# Patient Record
Sex: Female | Born: 1985 | Race: White | Hispanic: No | Marital: Married | Smoking: Never smoker
Health system: Southern US, Community
[De-identification: ages and names within clinical notes are randomized; demographics above are authoritative.]

## PROBLEM LIST (undated history)

## (undated) ENCOUNTER — Inpatient Hospital Stay (HOSPITAL_COMMUNITY): Payer: Self-pay

## (undated) DIAGNOSIS — E781 Pure hyperglyceridemia: Secondary | ICD-10-CM

## (undated) DIAGNOSIS — R51 Headache: Secondary | ICD-10-CM

## (undated) DIAGNOSIS — G4700 Insomnia, unspecified: Secondary | ICD-10-CM

## (undated) DIAGNOSIS — Z789 Other specified health status: Secondary | ICD-10-CM

## (undated) HISTORY — PX: AUGMENTATION MAMMAPLASTY: SUR837

## (undated) HISTORY — DX: Pure hyperglyceridemia: E78.1

---
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
## 2006-01-04 HISTORY — PX: BREAST ENHANCEMENT SURGERY: SHX7

## 2010-06-26 ENCOUNTER — Ambulatory Visit (HOSPITAL_COMMUNITY)
Admission: RE | Admit: 2010-06-26 | Discharge: 2010-06-26 | Disposition: A | Discharge status: Home / Self Care | Payer: Managed Care, Other (non HMO) | Source: Ambulatory Visit | Attending: Family Medicine | Admitting: Family Medicine

## 2010-06-26 ENCOUNTER — Other Ambulatory Visit: Payer: Self-pay | Admitting: Family Medicine

## 2010-06-26 DIAGNOSIS — S161XXA Strain of muscle, fascia and tendon at neck level, initial encounter: Secondary | ICD-10-CM

## 2010-06-26 DIAGNOSIS — M542 Cervicalgia: Secondary | ICD-10-CM | POA: Insufficient documentation

## 2010-11-23 LAB — OB RESULTS CONSOLE GC/CHLAMYDIA: Chlamydia: NEGATIVE

## 2010-11-23 LAB — OB RESULTS CONSOLE HIV ANTIBODY (ROUTINE TESTING): HIV: NONREACTIVE

## 2010-11-23 LAB — OB RESULTS CONSOLE HEPATITIS B SURFACE ANTIGEN: Hepatitis B Surface Ag: NEGATIVE

## 2010-11-23 LAB — OB RESULTS CONSOLE ABO/RH: RH Type: POSITIVE

## 2010-11-23 LAB — OB RESULTS CONSOLE RUBELLA ANTIBODY, IGM: Rubella: IMMUNE

## 2010-11-23 LAB — OB RESULTS CONSOLE ANTIBODY SCREEN: Antibody Screen: NEGATIVE

## 2011-01-05 NOTE — L&D Delivery Note (Signed)
Operative Delivery Note At 12:48 PM a viable female was delivered via Vaginal, Vacuum Investment banker, operational).  Presentation: vertex; Position: Left,, Occiput,, Anterior; Station: +3.  Verbal consent: obtained from patient.  Risks and benefits discussed in detail.  Risks include, but are not limited to the risks of anesthesia, bleeding, infection, damage to maternal tissues, fetal cephalhematoma.  There is also the risk of inability to effect vaginal delivery of the head, or shoulder dystocia that cannot be resolved by established maneuvers, leading to the need for emergency cesarean section.  APGAR: , ; weight .   Placenta status: , .   Cord:  with the following complications: .  Cord pH: pending   Pt pushing X 2.5 hrs, LOA @ +3, bladder drained, KIWI applied X 2 traction efforts, no popoffs, MLE with loc + epidural, female, APGARS 9/9, ph sent loose nuchal cord X 1 Anesthesia: Epidural  Instruments: KIWI VE Episiotomy: Median Lacerations: None Suture Repair: 3.0 vicryl vicryl rapide Est. Blood Loss (mL):   Mom to postpartum.  Baby to nursery-stable.  Patrice Matthew M 06/28/2011, 1:07 PM

## 2011-01-08 ENCOUNTER — Encounter (HOSPITAL_COMMUNITY): Payer: Self-pay | Admitting: *Deleted

## 2011-01-08 ENCOUNTER — Inpatient Hospital Stay (HOSPITAL_COMMUNITY)
Admission: AD | Admit: 2011-01-08 | Discharge: 2011-01-09 | Disposition: A | Discharge status: Home / Self Care | Payer: 59 | Source: Ambulatory Visit | Attending: Obstetrics and Gynecology | Admitting: Obstetrics and Gynecology

## 2011-01-08 DIAGNOSIS — Z331 Pregnant state, incidental: Secondary | ICD-10-CM

## 2011-01-08 DIAGNOSIS — G43909 Migraine, unspecified, not intractable, without status migrainosus: Secondary | ICD-10-CM | POA: Insufficient documentation

## 2011-01-08 DIAGNOSIS — O99891 Other specified diseases and conditions complicating pregnancy: Secondary | ICD-10-CM | POA: Insufficient documentation

## 2011-01-08 HISTORY — DX: Headache: R51

## 2011-01-08 LAB — URINALYSIS, ROUTINE W REFLEX MICROSCOPIC
Bilirubin Urine: NEGATIVE
Glucose, UA: NEGATIVE mg/dL
Ketones, ur: >80 mg/dL — AB
Leukocytes, UA: NEGATIVE
Nitrite: NEGATIVE
Protein, ur: NEGATIVE mg/dL
Specific Gravity, Urine: 1.015 (ref 1.005–1.030)
Urobilinogen, UA: 0.2 mg/dL (ref 0.0–1.0)
pH: 6.5 (ref 5.0–8.0)

## 2011-01-08 LAB — URINE MICROSCOPIC-ADD ON

## 2011-01-08 MED ORDER — SODIUM CHLORIDE 0.9 % IV SOLN
Freq: Once | INTRAVENOUS | Status: AC
  Administered 2011-01-08: 23:00:00 via INTRAVENOUS

## 2011-01-08 MED ORDER — HYDROMORPHONE HCL PF 1 MG/ML IJ SOLN
1.0000 mg | Freq: Once | INTRAMUSCULAR | Status: AC
  Administered 2011-01-08: 1 mg via INTRAVENOUS
  Filled 2011-01-08: qty 1

## 2011-01-08 MED ORDER — PROMETHAZINE HCL 25 MG/ML IJ SOLN
25.0000 mg | Freq: Once | INTRAVENOUS | Status: AC
  Administered 2011-01-08: 25 mg via INTRAVENOUS
  Filled 2011-01-08: qty 1

## 2011-01-08 NOTE — ED Notes (Signed)
Pt states with ice on R temple h/a is "0" pain due to ice numbing it. If ice off, pain about 7

## 2011-01-08 NOTE — ED Notes (Signed)
Heat to hands to facilitate IV start

## 2011-01-08 NOTE — Progress Notes (Signed)
Pt states, " I've had a headache since 9 am. I was out of phenergan, so then I also started vomiting. I was able to eat breakfast early, but since then everything I eat or drink comes back up."

## 2011-01-08 NOTE — ED Provider Notes (Signed)
History     Chief Complaint  Patient presents with  . Headache  . Emesis   HPI Kara Schwartz 25 y.o. Comes to MAU with nausea, vomiting and migraine since this morning.  Is out of Phenergan at home.  Vomited the Dilaudid she takes for migraines.  Currently holding and ice pack on the right side of her head.  Appears to be in pain.  OB History    Grav Para Term Preterm Abortions TAB SAB Ect Mult Living   1               Past Medical History  Diagnosis Date  . Headache     Past Surgical History  Procedure Date  . Breast enhancement surgery 2008    Family History  Problem Relation Age of Onset  . Anesthesia problems Neg Hx   . Hypotension Neg Hx   . Malignant hyperthermia Neg Hx   . Pseudochol deficiency Neg Hx     History  Substance Use Topics  . Smoking status: Never Smoker   . Smokeless tobacco: Never Used  . Alcohol Use: No    Allergies: No Known Allergies  Prescriptions prior to admission  Medication Sig Dispense Refill  . acetaminophen (TYLENOL) 325 MG tablet Take 650 mg by mouth every 6 (six) hours as needed. For pain.       Marland Kitchen acetaminophen-codeine (TYLENOL #3) 300-30 MG per tablet Take 1 tablet by mouth every 4 (four) hours as needed. For pain.       Marland Kitchen HYDROmorphone (DILAUDID) 2 MG tablet Take 2 mg by mouth every 4 (four) hours as needed. For pain.       . promethazine (PHENERGAN) 25 MG tablet Take 25 mg by mouth every 6 (six) hours as needed. For nausea.       Marland Kitchen terconazole (TERAZOL 3) 0.8 % vaginal cream Place 1 applicator vaginally at bedtime. Pt is in the second day of three day course of medication.         ROS Physical Exam   Blood pressure 119/79, pulse 98, temperature 98.9 F (37.2 C), temperature source Oral, resp. rate 20, height 5' 4.5" (1.638 m), weight 113 lb (51.256 kg).  Physical Exam  Nursing note and vitals reviewed. Constitutional: She is oriented to person, place, and time. She appears well-developed and well-nourished.  HENT:    Head: Normocephalic.  Eyes: EOM are normal.  Neck: Neck supple.  Musculoskeletal: Normal range of motion.  Neurological: She is alert and oriented to person, place, and time.  Skin: Skin is warm and dry.    MAU Course  Procedures  MDM Consult with Dr. Rana Snare re: plan of care Has had IVF with phenergan and dilaudid 1 mg iv.  Awakened client.  Able to take crackers by mouth.  Will discharge home.   Assessment and Plan  Migraine in pregnancy  Plan Keep appointment in the office on Thursday. Will need referral to neurologist to evaluate migraines.   Kara Schwartz 01/08/2011, 8:39 PM   Nolene Bernheim, NP 01/09/11 0028

## 2011-01-09 NOTE — Progress Notes (Signed)
Written and verbal d/c instructions given and understanding voiced. 

## 2011-01-09 NOTE — Progress Notes (Signed)
Lilyan Punt NP in earlier to discuss d/c plan with pt. Pt has Phenergan called in at pharmacy at [facility] but was unable to get it Friday before pharmacy closed. Will get in am.

## 2011-06-27 ENCOUNTER — Inpatient Hospital Stay (HOSPITAL_COMMUNITY): Payer: 59 | Admitting: Anesthesiology

## 2011-06-27 ENCOUNTER — Encounter (HOSPITAL_COMMUNITY): Payer: Self-pay | Admitting: Anesthesiology

## 2011-06-27 ENCOUNTER — Inpatient Hospital Stay (HOSPITAL_COMMUNITY)
Admission: AD | Admit: 2011-06-27 | Discharge: 2011-06-30 | DRG: 775 | Disposition: A | Discharge status: Home / Self Care | Payer: 59 | Source: Ambulatory Visit | Attending: Obstetrics and Gynecology | Admitting: Obstetrics and Gynecology

## 2011-06-27 ENCOUNTER — Encounter (HOSPITAL_COMMUNITY): Payer: Self-pay | Admitting: Obstetrics and Gynecology

## 2011-06-27 HISTORY — DX: Other specified health status: Z78.9

## 2011-06-27 LAB — SAMPLE TO BLOOD BANK

## 2011-06-27 LAB — CBC
MCH: 31.8 pg (ref 26.0–34.0)
MCHC: 34.3 g/dL (ref 30.0–36.0)
MCV: 92.7 fL (ref 78.0–100.0)
Platelets: 162 10*3/uL (ref 150–400)
RBC: 4.09 MIL/uL (ref 3.87–5.11)
RDW: 13.3 % (ref 11.5–15.5)

## 2011-06-27 LAB — POCT FERN TEST: Fern Test: POSITIVE

## 2011-06-27 MED ORDER — LACTATED RINGERS IV SOLN: 500.0000 mL | INTRAVENOUS | Status: DC | PRN

## 2011-06-27 MED ORDER — LACTATED RINGERS IV SOLN
500.0000 mL | Freq: Once | INTRAVENOUS | Status: AC
  Administered 2011-06-27: 17:00:00 via INTRAVENOUS

## 2011-06-27 MED ORDER — SODIUM BICARBONATE 8.4 % IV SOLN
INTRAVENOUS | Status: DC | PRN
  Administered 2011-06-27: 5 mL via EPIDURAL

## 2011-06-27 MED ORDER — PHENYLEPHRINE 40 MCG/ML (10ML) SYRINGE FOR IV PUSH (FOR BLOOD PRESSURE SUPPORT)
80.0000 ug | PREFILLED_SYRINGE | INTRAVENOUS | Status: DC | PRN
Start: 2011-06-27 — End: 2011-06-28

## 2011-06-27 MED ORDER — PHENYLEPHRINE 40 MCG/ML (10ML) SYRINGE FOR IV PUSH (FOR BLOOD PRESSURE SUPPORT)
80.0000 ug | PREFILLED_SYRINGE | INTRAVENOUS | Status: DC | PRN
  Filled 2011-06-27 (×2): qty 5

## 2011-06-27 MED ORDER — FLEET ENEMA 7-19 GM/118ML RE ENEM: 1.0000 | ENEMA | RECTAL | Status: DC | PRN

## 2011-06-27 MED ORDER — IBUPROFEN 600 MG PO TABS
600.0000 mg | ORAL_TABLET | Freq: Four times a day (QID) | ORAL | Status: DC | PRN
  Administered 2011-06-28: 600 mg via ORAL
  Filled 2011-06-27: qty 1

## 2011-06-27 MED ORDER — EPHEDRINE 5 MG/ML INJ: 10.0000 mg | INTRAVENOUS | Status: DC | PRN

## 2011-06-27 MED ORDER — ACETAMINOPHEN 325 MG PO TABS: 650.0000 mg | ORAL_TABLET | ORAL | Status: DC | PRN

## 2011-06-27 MED ORDER — OXYCODONE-ACETAMINOPHEN 5-325 MG PO TABS: 1.0000 | ORAL_TABLET | ORAL | Status: DC | PRN

## 2011-06-27 MED ORDER — LACTATED RINGERS IV SOLN
INTRAVENOUS | Status: DC
  Administered 2011-06-27 (×2): via INTRAVENOUS
  Administered 2011-06-28: 125 mL/h via INTRAVENOUS

## 2011-06-27 MED ORDER — LIDOCAINE HCL (PF) 1 % IJ SOLN
INTRAMUSCULAR | Status: DC | PRN
  Administered 2011-06-27 (×2): 8 mL

## 2011-06-27 MED ORDER — FENTANYL 2.5 MCG/ML BUPIVACAINE 1/10 % EPIDURAL INFUSION (WH - ANES)
INTRAMUSCULAR | Status: DC | PRN
  Administered 2011-06-27: 14 mL/h via EPIDURAL

## 2011-06-27 MED ORDER — LIDOCAINE HCL (PF) 1 % IJ SOLN
30.0000 mL | INTRAMUSCULAR | Status: DC | PRN
  Administered 2011-06-28: 30 mL via SUBCUTANEOUS
  Filled 2011-06-27: qty 30

## 2011-06-27 MED ORDER — OXYTOCIN 40 UNITS IN LACTATED RINGERS INFUSION - SIMPLE MED
62.5000 mL/h | Freq: Once | INTRAVENOUS | Status: AC
  Administered 2011-06-28: 2.5 [IU]/h via INTRAVENOUS

## 2011-06-27 MED ORDER — EPHEDRINE 5 MG/ML INJ
10.0000 mg | INTRAVENOUS | Status: DC | PRN
  Administered 2011-06-27: 10 mg via INTRAVENOUS
  Filled 2011-06-27 (×2): qty 4

## 2011-06-27 MED ORDER — TERBUTALINE SULFATE 1 MG/ML IJ SOLN: 0.2500 mg | Freq: Once | INTRAMUSCULAR | Status: AC | PRN

## 2011-06-27 MED ORDER — ONDANSETRON HCL 4 MG/2ML IJ SOLN: 4.0000 mg | Freq: Four times a day (QID) | INTRAMUSCULAR | Status: DC | PRN

## 2011-06-27 MED ORDER — OXYTOCIN BOLUS FROM INFUSION
250.0000 mL | Freq: Once | INTRAVENOUS | Status: AC
  Administered 2011-06-28: 250 mL via INTRAVENOUS
  Filled 2011-06-27: qty 500

## 2011-06-27 MED ORDER — FENTANYL 2.5 MCG/ML BUPIVACAINE 1/10 % EPIDURAL INFUSION (WH - ANES)
14.0000 mL/h | INTRAMUSCULAR | Status: DC
  Administered 2011-06-27 – 2011-06-28 (×5): 14 mL/h via EPIDURAL
  Filled 2011-06-27 (×6): qty 60

## 2011-06-27 MED ORDER — OXYTOCIN 40 UNITS IN LACTATED RINGERS INFUSION - SIMPLE MED
1.0000 m[IU]/min | INTRAVENOUS | Status: DC
  Administered 2011-06-27: 1 m[IU]/min via INTRAVENOUS
  Filled 2011-06-27: qty 1000

## 2011-06-27 MED ORDER — CITRIC ACID-SODIUM CITRATE 334-500 MG/5ML PO SOLN: 30.0000 mL | ORAL | Status: DC | PRN

## 2011-06-27 MED ORDER — DIPHENHYDRAMINE HCL 50 MG/ML IJ SOLN: 12.5000 mg | INTRAMUSCULAR | Status: DC | PRN

## 2011-06-27 NOTE — Progress Notes (Addendum)
No note

## 2011-06-27 NOTE — H&P (Signed)
26 year old G 1 P 0 at 76 w 2 days presents with SROM earlier this pm.  PNC see [facility] - GBBS is negative She was 1 cm dilated on admission and now status post epidural.  Afebrile Vital signs stable General alert and oriented Lung CTAB Car RRR Abdomen Gravid Cervix 80%/2.5 cm/-2 Vertex IUPC placed  IMPRESSION: IUP at 39 w 2 days SROM  PLAN: Pitocin Follow labor curve

## 2011-06-27 NOTE — Anesthesia Preprocedure Evaluation (Signed)

## 2011-06-27 NOTE — Anesthesia Procedure Notes (Addendum)
Epidural Patient location during procedure: OB Start time: 06/27/2011 5:28 PM End time: 06/27/2011 5:45 PM Reason for block: procedure for pain  Staffing Anesthesiologist: Sandrea Hughs Performed by: anesthesiologist   Preanesthetic Checklist Completed: patient identified, site marked, surgical consent, pre-op evaluation, timeout performed, IV checked, risks and benefits discussed and monitors and equipment checked  Epidural Patient position: sitting Prep: site prepped and draped and DuraPrep Patient monitoring: continuous pulse ox and blood pressure Approach: midline Injection technique: LOR air  Needle:  Needle type: Tuohy  Needle gauge: 17 G Needle length: 9 cm Needle insertion depth: 5 cm cm Catheter type: closed end flexible Catheter size: 19 Gauge Catheter at skin depth: 10 cm Test dose: negative and Other  Assessment Sensory level: T10 Events: blood not aspirated, injection not painful, no injection resistance, negative IV test and no paresthesia  Additional Notes Attempts X 2, because of scoliosis.  Epidural Patient location during procedure: OB  Preanesthetic Checklist Completed: patient identified, site marked, surgical consent, pre-op evaluation, timeout performed, IV checked, risks and benefits discussed and monitors and equipment checked  Epidural Patient position: sitting Prep: site prepped and draped and DuraPrep Patient monitoring: continuous pulse ox and blood pressure Approach: midline Injection technique: LOR air  Needle:  Needle type: Tuohy  Needle gauge: 17 G Needle length: 9 cm Needle insertion depth: 4 cm Catheter type: closed end flexible Catheter size: 19 Gauge Catheter at skin depth: 9 cm Test dose: negative  Assessment Events: blood not aspirated, injection not painful, no injection resistance, negative IV test and no paresthesia  Additional Notes Dosing of Epidural:  1st dose, through needle  ............................................Marland Kitchen epi 1:200K + Xylocaine 40 mg  2nd dose, through catheter, after waiting 3 minutes...Marland KitchenMarland Kitchenepi 1:200K + Xylocaine 60 mg  3rd dose, through catheter after waiting 3 minutes .............................Marcaine   5mg     ( 2% Xylo charted as a single dose in Epic Meds for ease of charting; actual dosing was fractionated as above, for saftey's sake)  As each dose occurred, patient was free of IV sx; and patient exhibited no evidence of SA injection.  Patient is more comfortable after epidural dosed. Please see RN's note for documentation of vital signs,and FHR which are stable.  Patient reminded not to try to ambulate with numb legs, and that an RN must be present when she attempts to get up.

## 2011-06-27 NOTE — MAU Note (Signed)
"  I started contracting since 1030.  On the way down here, the pain got more intense.  While being here in this bed.  I think something is coming out from down there.  My back is hurting like back labor.  (+) FM."

## 2011-06-28 ENCOUNTER — Encounter (HOSPITAL_COMMUNITY): Payer: Self-pay

## 2011-06-28 LAB — TYPE AND SCREEN
ABO/RH(D): A POS
Antibody Screen: NEGATIVE

## 2011-06-28 LAB — ABO/RH: ABO/RH(D): A POS

## 2011-06-28 LAB — RPR: RPR Ser Ql: NONREACTIVE

## 2011-06-28 MED ORDER — ZOLPIDEM TARTRATE 5 MG PO TABS: 5.0000 mg | ORAL_TABLET | Freq: Every evening | ORAL | Status: DC | PRN

## 2011-06-28 MED ORDER — ONDANSETRON HCL 4 MG PO TABS: 4.0000 mg | ORAL_TABLET | ORAL | Status: DC | PRN

## 2011-06-28 MED ORDER — DIPHENHYDRAMINE HCL 25 MG PO CAPS: 25.0000 mg | ORAL_CAPSULE | Freq: Four times a day (QID) | ORAL | Status: DC | PRN

## 2011-06-28 MED ORDER — WITCH HAZEL-GLYCERIN EX PADS
1.0000 "application " | MEDICATED_PAD | CUTANEOUS | Status: DC | PRN
  Administered 2011-06-28: 1 via TOPICAL

## 2011-06-28 MED ORDER — PRENATAL MULTIVITAMIN CH
1.0000 | ORAL_TABLET | Freq: Every day | ORAL | Status: DC
  Administered 2011-06-29 – 2011-06-30 (×2): 1 via ORAL
  Filled 2011-06-28: qty 1

## 2011-06-28 MED ORDER — OXYTOCIN 40 UNITS IN LACTATED RINGERS INFUSION - SIMPLE MED
1.0000 m[IU]/min | INTRAVENOUS | Status: DC
  Administered 2011-06-28: 9 m[IU]/min via INTRAVENOUS

## 2011-06-28 MED ORDER — SENNOSIDES-DOCUSATE SODIUM 8.6-50 MG PO TABS
2.0000 | ORAL_TABLET | Freq: Every day | ORAL | Status: DC
  Administered 2011-06-28 – 2011-06-29 (×2): 2 via ORAL

## 2011-06-28 MED ORDER — TETANUS-DIPHTH-ACELL PERTUSSIS 5-2.5-18.5 LF-MCG/0.5 IM SUSP: 0.5000 mL | Freq: Once | INTRAMUSCULAR | Status: DC

## 2011-06-28 MED ORDER — MEASLES, MUMPS & RUBELLA VAC ~~LOC~~ INJ: 0.5000 mL | INJECTION | Freq: Once | SUBCUTANEOUS | Status: DC

## 2011-06-28 MED ORDER — OXYCODONE-ACETAMINOPHEN 5-325 MG PO TABS
1.0000 | ORAL_TABLET | Freq: Four times a day (QID) | ORAL | Status: DC | PRN
  Administered 2011-06-28 (×2): 1 via ORAL
  Administered 2011-06-29 – 2011-06-30 (×6): 2 via ORAL
  Filled 2011-06-28: qty 2
  Filled 2011-06-28: qty 1
  Filled 2011-06-28 (×6): qty 2
  Filled 2011-06-28: qty 1

## 2011-06-28 MED ORDER — SIMETHICONE 80 MG PO CHEW: 80.0000 mg | CHEWABLE_TABLET | ORAL | Status: DC | PRN

## 2011-06-28 MED ORDER — BENZOCAINE-MENTHOL 20-0.5 % EX AERO
1.0000 "application " | INHALATION_SPRAY | CUTANEOUS | Status: DC | PRN
  Administered 2011-06-28 – 2011-06-30 (×2): 1 via TOPICAL
  Filled 2011-06-28 (×2): qty 56

## 2011-06-28 MED ORDER — ONDANSETRON HCL 4 MG/2ML IJ SOLN: 4.0000 mg | INTRAMUSCULAR | Status: DC | PRN

## 2011-06-28 MED ORDER — BISACODYL 10 MG RE SUPP
10.0000 mg | Freq: Every day | RECTAL | Status: DC | PRN
  Filled 2011-06-28: qty 1

## 2011-06-28 MED ORDER — IBUPROFEN 800 MG PO TABS
800.0000 mg | ORAL_TABLET | Freq: Three times a day (TID) | ORAL | Status: DC | PRN
  Administered 2011-06-28 – 2011-06-30 (×6): 800 mg via ORAL
  Filled 2011-06-28 (×7): qty 1

## 2011-06-28 MED ORDER — FLEET ENEMA 7-19 GM/118ML RE ENEM: 1.0000 | ENEMA | Freq: Every day | RECTAL | Status: DC | PRN

## 2011-06-28 MED ORDER — LANOLIN HYDROUS EX OINT: TOPICAL_OINTMENT | CUTANEOUS | Status: DC | PRN

## 2011-06-28 MED ORDER — DIBUCAINE 1 % RE OINT
1.0000 "application " | TOPICAL_OINTMENT | RECTAL | Status: DC | PRN
  Filled 2011-06-28: qty 28

## 2011-06-28 NOTE — Progress Notes (Signed)
Starting pit as 2X 2Mu/min protocol, now 5/90/-1, stable FHR

## 2011-06-29 LAB — CBC
HCT: 30.1 % — ABNORMAL LOW (ref 36.0–46.0)
Hemoglobin: 10.3 g/dL — ABNORMAL LOW (ref 12.0–15.0)
MCHC: 34.2 g/dL (ref 30.0–36.0)
WBC: 12.4 10*3/uL — ABNORMAL HIGH (ref 4.0–10.5)

## 2011-06-29 MED ORDER — HYDROCORTISONE 2.5 % RE CREA
TOPICAL_CREAM | Freq: Four times a day (QID) | RECTAL | Status: DC
  Filled 2011-06-29: qty 28.35

## 2011-06-29 MED ORDER — HYDROCORTISONE ACE-PRAMOXINE 1-1 % RE FOAM
1.0000 | Freq: Two times a day (BID) | RECTAL | Status: DC
  Administered 2011-06-29 – 2011-06-30 (×3): 1 via RECTAL
  Filled 2011-06-29 (×2): qty 10

## 2011-06-29 NOTE — Progress Notes (Signed)
Post Partum Day 1 Subjective: no complaints, up ad lib, voiding and tolerating PO  Objective: Blood pressure 103/66, pulse 98, temperature 97.8 F (36.6 C), temperature source Oral, resp. rate 18, height 5\' 4"  (1.626 m), weight 64.411 kg (142 lb), SpO2 98.00%, unknown if currently breastfeeding.  Physical Exam:  General: alert and cooperative Lochia: appropriate Uterine Fundus: firm Incision: perineum intact, labial edema noted DVT Evaluation: No evidence of DVT seen on physical exam.   Basename 06/29/11 0540 06/27/11 1540  HGB 10.3* 13.0  HCT 30.1* 37.9    Assessment/Plan: Plan for discharge tomorrow   LOS: 2 days   CURTIS,CAROL G 06/29/2011, 8:05 AM

## 2011-06-29 NOTE — Anesthesia Postprocedure Evaluation (Signed)
  Anesthesia Post-op Note  Patient: Kara Schwartz  Procedure(s) Performed: * No procedures listed *  Patient Location: Mother/Baby  Anesthesia Type: Epidural  Level of Consciousness: awake  Airway and Oxygen Therapy: Patient Spontanous Breathing  Post-op Pain: none  Post-op Assessment: Post-op Vital signs reviewed  Post-op Vital Signs: Reviewed  Complications: No apparent anesthesia complications

## 2011-06-30 MED ORDER — OXYCODONE-ACETAMINOPHEN 5-325 MG PO TABS: 1.0000 | ORAL_TABLET | Freq: Four times a day (QID) | ORAL | Status: AC | PRN

## 2011-06-30 MED ORDER — IBUPROFEN 800 MG PO TABS
800.0000 mg | ORAL_TABLET | Freq: Three times a day (TID) | ORAL | Status: AC | PRN
Start: 2011-06-30 — End: 2011-07-10

## 2011-06-30 MED ORDER — HYDROCORTISONE ACE-PRAMOXINE 1-1 % RE FOAM: 1.0000 | Freq: Two times a day (BID) | RECTAL | Status: AC

## 2011-06-30 NOTE — Progress Notes (Signed)
Post Partum Day 2 Subjective: up ad lib, voiding, tolerating PO and + flatus  Objective: Blood pressure 106/68, pulse 79, temperature 97.6 F (36.4 C), temperature source Oral, resp. rate 18, height 5\' 4"  (1.626 m), weight 64.411 kg (142 lb), SpO2 98.00%, unknown if currently breastfeeding.  Physical Exam:  General: alert Lochia: appropriate Uterine Fundus: firm Incision: perineum intact, hemorrhoids DVT Evaluation: No evidence of DVT seen on physical exam.   Basename 06/29/11 0540 06/27/11 1540  HGB 10.3* 13.0  HCT 30.1* 37.9    Assessment/Plan: Discharge home   LOS: 3 days   Antar Milks G 06/30/2011, 7:46 AM

## 2011-06-30 NOTE — Discharge Summary (Signed)
Obstetric Discharge Summary Reason for Admission: rupture of membranes Prenatal Procedures: ultrasound Intrapartum Procedures: vacuum Postpartum Procedures: none Complications-Operative and Postpartum: MLE Hemoglobin  Date Value Range Status  06/29/2011 10.3* 12.0 - 15.0 Schwartz/dL Final     DELTA CHECK NOTED     REPEATED TO VERIFY     HCT  Date Value Range Status  06/29/2011 30.1* 36.0 - 46.0 % Final    Physical Exam:  General: alert and cooperative Lochia: appropriate Uterine Fundus: firm Incision: perineum intact DVT Evaluation: No evidence of DVT seen on physical exam.  Discharge Diagnoses: Term Pregnancy-delivered  Discharge Information: Date: 06/30/2011 Activity: pelvic rest Diet: routine Medications: PNV, Ibuprofen, Percocet and proctofoam HC Condition: stable Instructions: refer to practice specific booklet Discharge to: home   Newborn Data: Live born female  Birth Weight: 8 lb 15.7 oz (4075 Schwartz) APGAR: 7, 8  Home with mother.  Kara Schwartz 06/30/2011, 7:56 AM

## 2012-08-02 ENCOUNTER — Ambulatory Visit (INDEPENDENT_AMBULATORY_CARE_PROVIDER_SITE_OTHER): Payer: Managed Care, Other (non HMO) | Admitting: Family Medicine

## 2012-08-02 ENCOUNTER — Encounter: Payer: Self-pay | Admitting: Family Medicine

## 2012-08-02 VITALS — BP 114/78 | Wt 123.0 lb

## 2012-08-02 DIAGNOSIS — G43909 Migraine, unspecified, not intractable, without status migrainosus: Secondary | ICD-10-CM

## 2012-08-02 MED ORDER — TOPIRAMATE 50 MG PO TABS: 50.0000 mg | ORAL_TABLET | Freq: Two times a day (BID) | ORAL | Status: DC

## 2012-08-02 MED ORDER — ONDANSETRON HCL 8 MG PO TABS: 8.0000 mg | ORAL_TABLET | Freq: Three times a day (TID) | ORAL | Status: DC | PRN

## 2012-08-02 MED ORDER — ZOLMITRIPTAN 5 MG PO TABS: 5.0000 mg | ORAL_TABLET | ORAL | Status: DC | PRN

## 2012-08-02 NOTE — Progress Notes (Signed)
  Subjective:    Patient ID: Kara Schwartz, female    DOB: 02-04-1985, 27 y.o.   MRN: [id number]  HPI Here for migraine headaches. Pt was on hydrocodone and voltaren until she got pregnant and now that she is finished breast feeding. Headaches are getting worse and would like to talk about getting a med to help prevent migraines. Extensive discussion held regarding migraines she gets several per month many times very severe with pounding behind the left thigh nausea occasional vomiting. Does not wake her up in the middle night occasionally wakes up in the morning with a headache does not smoke uses some caffeine in the morning under a moderate amount of stress eats properly exercises. Stays at home with a 80-year-old. Marriage going well. Lives in [place] currently hoping to move closer in the near future. PMH migraines family history migraines with the mother.  Review of Systems Negative see per above denies double vision denies middle of the night headaches no unilateral numbness or weakness.    Objective:   Physical Exam Lungs are clear hearts regular. Blood pressure good       Assessment & Plan:  Migraines-25-30 minutes was spent with the patient discussing migraines discussing options including preventative and abortive medicines several years ago patient use Topamax but she states she lost a fair amount await with it. Therefore what we will do is use Topamax 25 mg each evening for the first 4 days then 25 mg twice a day for another 4-7 days then 50 mg twice a day. Patient will followup with [place] in approximately 3 months to see how her headaches are doing she will keep a headache calendar. In addition to this Zomig as necessary for her migraines she was told how to use the medication. Zofran as needed for nausea avoid narcotics at this point. Avoidance of trigger measures were discussed regular exercise and proper rest also discussed.

## 2012-08-09 ENCOUNTER — Telehealth: Payer: Self-pay | Admitting: Family Medicine

## 2012-08-09 NOTE — Telephone Encounter (Signed)
Left message to return call 

## 2012-08-09 NOTE — Telephone Encounter (Signed)
Patient was just in to see Dr last week and patient was given an Rx for Zomig, but she only has 2 left because she is having so many migraines. She is calling to see if she can get another Rx of this. Please call patient when complete  [facility], [address]

## 2012-08-09 NOTE — Telephone Encounter (Signed)
She may refill medications x3, she also was prescribed Topamax please fax to find out if she is taking this. Also find out what dose of this she is at currently

## 2012-08-10 NOTE — Telephone Encounter (Signed)
Discussed with patient. She made a follow up with Dr. Lorin Picket

## 2012-08-10 NOTE — Telephone Encounter (Signed)
Patient to refill Zomig- and today is the day she has titrated up to the full dose of Topamax 50 mg one twice a day

## 2012-08-10 NOTE — Telephone Encounter (Signed)
LMRC

## 2012-08-10 NOTE — Telephone Encounter (Signed)
Pt to keep track of headaches, f/u within 4 weeks, if no improvement within 1 week notify us, may need titration of topamax

## 2012-08-14 ENCOUNTER — Telehealth: Payer: Self-pay | Admitting: Family Medicine

## 2012-08-14 ENCOUNTER — Other Ambulatory Visit: Payer: Self-pay

## 2012-08-14 MED ORDER — CEFPROZIL 500 MG PO TABS: 500.0000 mg | ORAL_TABLET | Freq: Two times a day (BID) | ORAL | Status: DC

## 2012-08-14 NOTE — Telephone Encounter (Signed)
Patient was seen 08/02/12 for migraines and now she wants something called in for a sinus infection. To [facility] on [address] , [place].

## 2012-08-14 NOTE — Telephone Encounter (Signed)
Notified patient we typically don't call in antibiotics but this one time the doctor will. Cefzil 500 mg 1 twice a day 10 days as directed. If ongoing symptoms need to be seen. Rx sent into pharmacy. Patient was notified and verbalized understanding.

## 2012-08-14 NOTE — Telephone Encounter (Signed)
NTC-it is important for patients know we typically don't call in antibiotics but this one time I will. Cefzil 500 mg 1 twice a day 10 days as directed. If ongoing symptoms need to be seen

## 2012-08-21 ENCOUNTER — Other Ambulatory Visit: Payer: Self-pay | Admitting: Nurse Practitioner

## 2012-08-21 ENCOUNTER — Telehealth: Payer: Self-pay | Admitting: Family Medicine

## 2012-08-21 MED ORDER — FLUCONAZOLE 150 MG PO TABS: ORAL_TABLET | ORAL | Status: DC

## 2012-08-21 NOTE — Telephone Encounter (Signed)
Pt would like Diflucan X2 for a yeast infection that she has now from the antibiotics she just finished taking. [facility] in [place]. (see previous mssg for Pharmacy info)

## 2012-08-23 ENCOUNTER — Encounter: Payer: Self-pay | Admitting: Family Medicine

## 2012-08-23 ENCOUNTER — Ambulatory Visit (INDEPENDENT_AMBULATORY_CARE_PROVIDER_SITE_OTHER): Payer: Managed Care, Other (non HMO) | Admitting: Family Medicine

## 2012-08-23 VITALS — BP 110/80 | Ht 64.0 in | Wt 121.6 lb

## 2012-08-23 DIAGNOSIS — G43909 Migraine, unspecified, not intractable, without status migrainosus: Secondary | ICD-10-CM

## 2012-08-23 MED ORDER — VERAPAMIL HCL ER 120 MG PO TBCR: 120.0000 mg | EXTENDED_RELEASE_TABLET | Freq: Every day | ORAL | Status: DC

## 2012-08-23 NOTE — Patient Instructions (Signed)
Send [place] update in 7 to 8 days

## 2012-08-23 NOTE — Progress Notes (Signed)
  Subjective:    Patient ID: Kara Schwartz, female    DOB: October 10, 1985, 27 y.o.   MRN: [id number]  Migraine  This is a chronic problem. The current episode started more than 1 year ago.   patient reviewed her headache calendar with me she had some sinus headaches that we treated with Cefzil and that's actually getting better. So mag also helps her standard migraines.  Patient states that medication that was prescribed to her for migraines is causing her to have depression, anxiety, and loss of appetite so she wants to know what can be done to help that Patient states that over the past couple weeks she is under some feeling somewhat lack of interest she denies being depressed but she does state she cries little more than normal she denies being suicidal or homicidal she states to some degree she was having some struggles before the Topamax but it got worse on the Topamax. She also states the headaches are getting somewhat better.  She takes care of her 50-month-old her husband is around a couple hours per day and on the weekends she also takes care of a 81-month-old does not get much help from him. Review of Systems See above. Headaches do not wake her up in the middle night no vomiting with it.    Objective:   Physical Exam  Blood pressure 110/80 lungs are clear hearts regular neck no masses neurologic grossly normal      Assessment & Plan:  #1 migraines-continue Zomig, stopped Topamax due to side effects, verapamil SR 120 mg one daily. Keep track of headaches give [place] an update within a few weeks how that is doing #2 patient does have some stress and depression-related symptoms but she does not meet the criteria for classic depression I am hoping that when we stopped the Topamax her symptoms will improve she is to give [place] feedback over the next 7-10 days if she is getting worse and certainly if within 2 weeks if she is not back to normal to give [place] feedback she may benefit from Celexa or  something similar but she will give [place] feedback soon Followup in 6 weeks

## 2012-08-30 ENCOUNTER — Telehealth: Payer: Self-pay | Admitting: Family Medicine

## 2012-08-30 ENCOUNTER — Other Ambulatory Visit: Payer: Self-pay | Admitting: Family Medicine

## 2012-08-30 DIAGNOSIS — F329 Major depressive disorder, single episode, unspecified: Secondary | ICD-10-CM | POA: Insufficient documentation

## 2012-08-30 MED ORDER — BUPROPION HCL ER (SR) 150 MG PO TB12: 150.0000 mg | ORAL_TABLET | Freq: Two times a day (BID) | ORAL | Status: DC

## 2012-08-30 MED ORDER — RIZATRIPTAN BENZOATE 10 MG PO TABS: 10.0000 mg | ORAL_TABLET | Freq: Once | ORAL | Status: DC | PRN

## 2012-08-30 NOTE — Progress Notes (Signed)
This patient was having some significant issues regarding feeling blue and sad. I talked with her at length on the phone spent 20 minutes. We discussed various options. She will be calling her insurance company to set up a Veterinary surgeon. She will also followup here within 2 weeks. She denies being suicidal. She states she will followup immediately should she become suicidal. Wellbutrin SR one in the evening for the first week then one twice a day. Hopefully this will help depression as well as migraines. In addition to this stop the Zomig try Maxalt 1 and a first-time headache may repeat 2 hours later if need be. This patient may end up needing to be referred to neurology for her headaches. Patient does not wake up with the headaches currently she is taking wrap until SR. She will call us if ongoing troubles otherwise followup in 2 weeks

## 2012-08-30 NOTE — Telephone Encounter (Signed)
States she is still feeling Depressed, Anexity and Headaches.  Would like to know what should the plan of action be at this point.  Please call Patient. Thanks

## 2012-08-30 NOTE — Telephone Encounter (Signed)
Patient was treated with Wellbutrin and Maxalt. Note was dictated. Patient to followup within 2 weeks. Prescription sent to her pharmacy.

## 2012-09-05 ENCOUNTER — Ambulatory Visit: Payer: Managed Care, Other (non HMO) | Admitting: Family Medicine

## 2012-10-23 ENCOUNTER — Telehealth: Payer: Self-pay | Admitting: Family Medicine

## 2012-10-23 NOTE — Telephone Encounter (Signed)
Patient states that this episode only happened the one time when she forgot to take the BP med. It hasn't happened anymore since.

## 2012-10-23 NOTE — Telephone Encounter (Signed)
Patient says that the blood pressure meds that she is on for migraines has been doing good, but one day patient forgot about the medication when she had a migraine and she did not realize it until a couple hours after and the medication states that if forgotten, take it within the hour of the normal time, so she thought it was too late to take it. That day she was having dizzy spells when she stood, but not when she sat down. Please advise.

## 2012-10-24 NOTE — Telephone Encounter (Signed)
Sounds reasonable that this occurred, if ongoing problem then OV otherwise to f/u by early Dec

## 2012-10-24 NOTE — Telephone Encounter (Signed)
Left message to return call 

## 2012-10-25 NOTE — Telephone Encounter (Signed)
Notified patient sounds reasonable that this occurred, if ongoing problem then OV otherwise to f/u by early Dec. Patient verbalized understanding.

## 2012-10-28 ENCOUNTER — Encounter: Payer: Self-pay | Admitting: *Deleted

## 2012-11-02 ENCOUNTER — Ambulatory Visit (INDEPENDENT_AMBULATORY_CARE_PROVIDER_SITE_OTHER): Payer: BC Managed Care – PPO | Admitting: Family Medicine

## 2012-11-02 ENCOUNTER — Encounter: Payer: Self-pay | Admitting: Family Medicine

## 2012-11-02 VITALS — BP 102/72 | Ht 64.0 in | Wt 122.1 lb

## 2012-11-02 DIAGNOSIS — G43909 Migraine, unspecified, not intractable, without status migrainosus: Secondary | ICD-10-CM

## 2012-11-02 MED ORDER — FLUCONAZOLE 150 MG PO TABS: ORAL_TABLET | ORAL | Status: DC

## 2012-11-02 MED ORDER — FLUTICASONE PROPIONATE 50 MCG/ACT NA SUSP: 2.0000 | Freq: Every day | NASAL | Status: DC

## 2012-11-02 MED ORDER — BUPROPION HCL ER (SR) 150 MG PO TB12: 150.0000 mg | ORAL_TABLET | Freq: Two times a day (BID) | ORAL | Status: DC

## 2012-11-02 MED ORDER — RIZATRIPTAN BENZOATE 10 MG PO TABS: 10.0000 mg | ORAL_TABLET | Freq: Once | ORAL | Status: DC | PRN

## 2012-11-02 MED ORDER — TIZANIDINE HCL 4 MG PO TABS: 4.0000 mg | ORAL_TABLET | Freq: Three times a day (TID) | ORAL | Status: DC | PRN

## 2012-11-02 MED ORDER — CEFPROZIL 500 MG PO TABS: 500.0000 mg | ORAL_TABLET | Freq: Two times a day (BID) | ORAL | Status: DC

## 2012-11-02 NOTE — Progress Notes (Signed)
  Subjective:    Patient ID: Kara Schwartz, female    DOB: 1985-06-21, 27 y.o.   MRN: [id number]  Migraine  This is a new problem. The current episode started more than 1 month ago. The problem has been unchanged. The pain does not radiate. The pain quality is not similar to prior headaches. Associated symptoms include sinus pressure. Nothing aggravates the symptoms. She has tried triptans for the symptoms. The treatment provided moderate relief.  Patient is here to follow up on migraines. She also states that she has been experiencing sinus headache/pressure for several weeks now.  She describes frontal sinus as well as temporal pain and discomfort. This happens multiple days a week. Her severe migraines are actually much better than what they were. She only had one dizzy spell on the verapamil. She feels Wellbutrin is helping her. She denies being depressed. She hopes to get moved up to a new house in this region by spring time She had seen Dr. Neale Burly in the past for her headaches does not feel that her headaches are bad enough to go back. Past medical history migraines has seen Dr. Neale Burly before Has been under some stress but dissection doing better Family history noncontributory Does not abuse drugs.  Review of Systems  HENT: Positive for sinus pressure.    Denies cough wheeze vomiting diarrhea fever chills    Objective:   Physical Exam  Lungs are clear, heart regular pulse normal eardrums normal throat is normal sinus nontender neck no masses      Assessment & Plan:  #1 migraines improvement with current path. Continue current medication. Tension headaches-I doubt that these her sinuses but she will watch her mucus when she uses her Neddi pot. If it starts becoming very discolored she will get a prescription for Cefzil filled. She will try Zanaflex at home when necessary for severe headaches. We will research if there is a better regimen to try then currently what she has.  We will get back to the patient on our findings. Patient followup in 3-4 months

## 2012-11-09 ENCOUNTER — Other Ambulatory Visit: Payer: Self-pay

## 2013-01-17 ENCOUNTER — Encounter: Payer: Self-pay | Admitting: Family Medicine

## 2013-01-17 ENCOUNTER — Ambulatory Visit (INDEPENDENT_AMBULATORY_CARE_PROVIDER_SITE_OTHER): Payer: BC Managed Care – PPO | Admitting: Family Medicine

## 2013-01-17 VITALS — BP 132/80 | Temp 98.0°F | Ht 65.0 in | Wt 123.0 lb

## 2013-01-17 DIAGNOSIS — G43909 Migraine, unspecified, not intractable, without status migrainosus: Secondary | ICD-10-CM

## 2013-01-17 NOTE — Progress Notes (Signed)
   Subjective:    Patient ID: Jimmey RalphVictoria W Rosenberg, female    DOB: 01/21/1985, 28 y.o.   MRN: [id number]  HPIHaving migraines daily. Taking rizatriptan. Wants referral to specialist for the migraines.   Having sinus pressure for about 1.5 weeks.  Patient without any sinus drainage. She describes it more as a frontal headache more on the right side than the left side. No coughing wheezing or postnasal drip. It is doubtful this is a sinus infection more likely it's manifestation of her headaches.   Review of Systems She denies cough wheezing fever vomiting blurred vision does not wake her up    Objective:   Physical Exam  Patient is stable. Neurologic grossly normal.      Assessment & Plan:  Migraines-I gave her a migraine diary to fill out and keep track of. Also told her to maintain her current medications. Also will refer patient to neurology for further evaluation of her headaches and treatments of these.  Patient was advised that if she has continued trouble with his sinuses she might even have to see a urine a stroke specialist but currently does not

## 2013-02-19 ENCOUNTER — Ambulatory Visit (INDEPENDENT_AMBULATORY_CARE_PROVIDER_SITE_OTHER): Payer: BC Managed Care – PPO | Admitting: Neurology

## 2013-02-19 ENCOUNTER — Encounter: Payer: Self-pay | Admitting: Neurology

## 2013-02-19 VITALS — BP 126/74 | HR 80 | Temp 98.0°F | Resp 18 | Ht 64.0 in | Wt 124.5 lb

## 2013-02-19 DIAGNOSIS — G43709 Chronic migraine without aura, not intractable, without status migrainosus: Secondary | ICD-10-CM

## 2013-02-19 DIAGNOSIS — G4440 Drug-induced headache, not elsewhere classified, not intractable: Secondary | ICD-10-CM

## 2013-02-19 MED ORDER — PROMETHAZINE HCL 12.5 MG PO TABS: 12.5000 mg | ORAL_TABLET | Freq: Four times a day (QID) | ORAL | Status: DC | PRN

## 2013-02-19 MED ORDER — NORTRIPTYLINE HCL 10 MG PO CAPS: 10.0000 mg | ORAL_CAPSULE | Freq: Every day | ORAL | Status: DC

## 2013-02-19 NOTE — Progress Notes (Signed)
NEUROLOGY CONSULTATION NOTE  [name] MRN: [id number] DOB: 05/12/1985  Referring provider: Dr. Gerda DissLuking Primary care provider: Dr. Gerda DissLuking  Reason for consult:  headache  HISTORY OF PRESENT ILLNESS: Kara Schwartz is a 28 year old right-handed woman who presents for migraine.  Records and images were personally reviewed where available.    Onset:  On and off since 28 years old Location:  [place] (unilateral either side or bilateral) Quality:  sharp Intensity:  6-10/10 Aura:  no Associated symptoms:  Nausea (if severe), photophobia, phonophobia, osmophobia Duration:  Constant without medication (2-3 hours with medication) Frequency:  daily Triggers/exacerbating factors:  none Relieving factors:  Ice pack, elastic head band Activity:  Force self to perform ADLs  Past abortive therapy:  Excedrin (helps), NSAIDs (sometimes helps) Past preventative therapy:  Verapamil 120mg  (ineffective, made lightheaded), topamax (initially helpful, weight loss, depression), bupropion (sleepy, ineffective)  Current abortive therapy:  Maxalt 10mg  (effective), Tylenol sinus (helpful),  Current preventative therapy:  none  Caffeine:  1 cup a day to once in a while Stress/depression:  no Sleep hygiene:  good Family history of headache:  Mother has headaches.  PAST MEDICAL HISTORY: Past Medical History  Diagnosis Date  . Headache(784.0)   . No pertinent past medical history   . Hypertriglyceridemia     PAST SURGICAL HISTORY: Past Surgical History  Procedure Laterality Date  . Breast enhancement surgery  2008    MEDICATIONS: Current Outpatient Prescriptions on File Prior to Visit  Medication Sig Dispense Refill  . acetaminophen (TYLENOL) 325 MG tablet Take 650 mg by mouth every 6 (six) hours as needed. For pain.       . fluticasone (FLONASE) 50 MCG/ACT nasal spray Place 2 sprays into the nose daily.  16 g  5  . rizatriptan (MAXALT) 10 MG tablet Take 1 tablet (10 mg total) by mouth  once as needed for migraine. May repeat in 2 hours if needed  30 tablet  5  . tiZANidine (ZANAFLEX) 4 MG tablet Take 1 tablet (4 mg total) by mouth every 8 (eight) hours as needed.  30 tablet  1  . buPROPion (WELLBUTRIN SR) 150 MG 12 hr tablet Take 1 tablet (150 mg total) by mouth 2 (two) times daily.  60 tablet  5  . verapamil (CALAN-SR) 120 MG CR tablet Take 1 tablet (120 mg total) by mouth daily.  30 tablet  11   No current facility-administered medications on file prior to visit.    ALLERGIES: No Known Allergies  FAMILY HISTORY: Family History  Problem Relation Age of Onset  . Anesthesia problems Neg Hx   . Hypotension Neg Hx   . Malignant hyperthermia Neg Hx   . Pseudochol deficiency Neg Hx   . Diabetes Father   . Stroke Father     SOCIAL HISTORY: History   Social History  . Marital Status: Married    Spouse Name: N/A    Number of Children: N/A  . Years of Education: N/A   Occupational History  . Not on file.   Social History Main Topics  . Smoking status: Never Smoker   . Smokeless tobacco: Never Used  . Alcohol Use: Yes     Comment: occasionally  . Drug Use: No  . Sexual Activity: Yes    Birth Control/ Protection: None   Other Topics Concern  . Not on file   Social History Narrative  . No narrative on file    REVIEW OF SYSTEMS: Constitutional: No fevers, chills, or  sweats, no generalized fatigue, change in appetite Eyes: No visual changes, double vision, eye pain Ear, nose and throat: No hearing loss, ear pain, nasal congestion, sore throat Cardiovascular: No chest pain, palpitations Respiratory:  No shortness of breath at rest or with exertion, wheezes GastrointestinaI: No nausea, vomiting, diarrhea, abdominal pain, fecal incontinence Genitourinary:  No dysuria, urinary retention or frequency Musculoskeletal:  No neck pain, back pain Integumentary: No rash, pruritus, skin lesions Neurological: as above Psychiatric: No depression, insomnia,  anxiety Endocrine: No palpitations, fatigue, diaphoresis, mood swings, change in appetite, change in weight, increased thirst Hematologic/Lymphatic:  No anemia, purpura, petechiae. Allergic/Immunologic: no itchy/runny eyes, nasal congestion, recent allergic reactions, rashes  PHYSICAL EXAM: Filed Vitals:   02/19/13 1321  BP: 126/74  Pulse: 80  Temp: 98 F (36.7 C)  Resp: 18   General: No acute distress Head:  Normocephalic/atraumatic Neck: supple, no paraspinal tenderness, full range of motion Back: No paraspinal tenderness Heart: regular rate and rhythm Lungs: Clear to auscultation bilaterally. Vascular: No carotid bruits. Neurological Exam: Mental status: alert and oriented to person, place, and time, speech fluent and not dysarthric, language intact. Cranial nerves: CN I: not tested CN II: pupils equal, round and reactive to light, visual fields intact, fundi unremarkable. CN III, IV, VI:  full range of motion, no nystagmus, no ptosis CN V: facial sensation intact CN VII: upper and lower face symmetric CN VIII: hearing intact CN IX, X: gag intact, uvula midline CN XI: sternocleidomastoid and trapezius muscles intact CN XII: tongue midline Bulk & Tone: normal, no fasciculations. Motor: 5/5 throughout Sensation: temperature and vibration intact. Deep Tendon Reflexes: 2+throughout, toes down Finger to nose testing: no dysmetria Heel to shin: no dysmetria Gait: normal stance and stride.  Able to turn and walk in tandem. Romberg negative.  IMPRESSION: Chronic migraine without aura. Medication-overuse headache  PLAN: 1.  Start nortriptyline 10mg  at bedtime.   2.  Use Maxalt for abortive therapy but no pain relievers more than 2 days out of week.  Phenergan for nausea. 3.  Follow up in 3 months.  Call in 4 weeks with update.  Thank you for allowing me to take part in the care of this patient.  Shon Millet, DO  CC:  Lilyan Punt, MD

## 2013-02-19 NOTE — Patient Instructions (Signed)
Migraine Recommendations: 1.  Continue Maxalt or Tylenol for acute migraine.  Take abortive medication at immediate onset of migraine. 2.  Limit use of pain relievers to no more than 2 days out of the week.  These medications include acetaminophen, ibuprofen, triptans and narcotics.  This will help reduce risk of rebound headaches.  If you are developing severe nausea, then try Phenergan.  Take 1 pill for nausea but also use sparingly if possible. 3.  Keep a headache diary. 4.  Stay adequately hydrated. 5.  Maintain good sleep hygiene. 6.  Maintain proper stress management. 7.  Start nortriptyline 10mg  at bedtime.  Side effects include sleepiness, dizziness or dry mouth.  Call in 4 weeks with update. 8.  Followup in 3 months.

## 2013-04-19 ENCOUNTER — Other Ambulatory Visit: Payer: Self-pay | Admitting: Neurology

## 2013-04-20 NOTE — Telephone Encounter (Signed)
Nortryptoline refill requested. Per last office note- patient to remain on medication. Refill approved and sent to patient's pharmacy.

## 2013-05-14 ENCOUNTER — Ambulatory Visit (INDEPENDENT_AMBULATORY_CARE_PROVIDER_SITE_OTHER): Payer: BC Managed Care – PPO | Admitting: Neurology

## 2013-05-14 ENCOUNTER — Encounter: Payer: Self-pay | Admitting: Neurology

## 2013-05-14 VITALS — BP 118/70 | HR 66 | Temp 98.2°F | Resp 18 | Ht 65.0 in | Wt 126.5 lb

## 2013-05-14 DIAGNOSIS — G43709 Chronic migraine without aura, not intractable, without status migrainosus: Secondary | ICD-10-CM

## 2013-05-14 MED ORDER — RIZATRIPTAN BENZOATE 10 MG PO TBDP: 10.0000 mg | ORAL_TABLET | ORAL | Status: DC | PRN

## 2013-05-14 MED ORDER — TIZANIDINE HCL 4 MG PO TABS: 4.0000 mg | ORAL_TABLET | Freq: Three times a day (TID) | ORAL | Status: DC | PRN

## 2013-05-14 MED ORDER — NORTRIPTYLINE HCL 25 MG PO CAPS: 25.0000 mg | ORAL_CAPSULE | Freq: Every day | ORAL | Status: DC

## 2013-05-14 NOTE — Patient Instructions (Signed)
1.  Increase nortriptyline to 25mg  at bedtime.  You may finish the 10mg  capsules by taking two at bedtime until finished. 2.  Maxalt for acute headache. 3.  Try cutting out caffeine all together 4.  Take tizanidine for neck pain but only as needed.  Try to limit use to only if you really need it.  Caution as it can make you drowsy. 5.  Call in 4 weeks with update.  Follow up in 3 months.

## 2013-05-14 NOTE — Progress Notes (Signed)
NEUROLOGY FOLLOW UP OFFICE NOTE  Kara RalphVictoria W Schwartz [id number]  HISTORY OF PRESENT ILLNESS: Kara Schwartz is a 28 year old right-handed woman who follows up for chronic migraine without aura and medication-overuse headache.  Records were personally reviewed.    UPDATE: Intensity:  8-9/10 Duration:  5-8 hours without meds (15-30 minutes with Maxalt) Frequency:  4 days/week (16 headache days per month)  Current abortive medication:  Maxalt 10mg  Current preventative medication:  Nortriptyline 10mg   Caffeine:  1 cup a day Stress/depression:  No, only regarding having sold the house and trying to find another place to live Sleep hygiene:  Good except when neck bothers her.  Over the past month or so, has noticed neck pain.  It is localized to running down both sides of the back of her neck.  It does not radiate down the arms and denies numbness or tingling in the arms or hands.  It sometimes really bothers her when she lays on her pillow, so she sometimes sleeps without the pillow.  It tends to be improved with ice pack or with neck in extended position while laying supine.  It does not hurt all time time.  She denies any trauma or strenuous activity that could have triggered it.  Sometimes she thinks that moving her neck may trigger a migraine.  HISTORY: Onset:  On and off since 28 years old Location:  [place] (unilateral either side or bilateral) Quality:  sharp Initial intensity:  10/10 Aura:  no Associated symptoms:  Nausea (if severe), photophobia, phonophobia, osmophobia Initial duration:  Constant without medication (2-3 hours with medication) Initial frequency:  daily Triggers/exacerbating factors:  none Relieving factors:  Ice pack, elastic head band Activity:  Force self to perform ADLs  Past abortive therapy:  Excedrin (helps), NSAIDs (sometimes helps); was taking almost daily. Past preventative therapy:  Verapamil 120mg  (ineffective, made lightheaded), topamax (initially  helpful, weight loss, depression), bupropion (sleepy, ineffective)  Family history of headache:  Mother has headaches.  PAST MEDICAL HISTORY: Past Medical History  Diagnosis Date  . Headache(784.0)   . No pertinent past medical history   . Hypertriglyceridemia     MEDICATIONS: Current Outpatient Prescriptions on File Prior to Visit  Medication Sig Dispense Refill  . acetaminophen (TYLENOL) 325 MG tablet Take 650 mg by mouth every 6 (six) hours as needed. For pain.       . fluticasone (FLONASE) 50 MCG/ACT nasal spray Place 2 sprays into the nose daily.  16 g  5  . promethazine (PHENERGAN) 12.5 MG tablet Take 1 tablet (12.5 mg total) by mouth every 6 (six) hours as needed for nausea or vomiting.  30 tablet  0  . buPROPion (WELLBUTRIN SR) 150 MG 12 hr tablet Take 1 tablet (150 mg total) by mouth 2 (two) times daily.  60 tablet  5  . verapamil (CALAN-SR) 120 MG CR tablet Take 1 tablet (120 mg total) by mouth daily.  30 tablet  11   No current facility-administered medications on file prior to visit.    ALLERGIES: No Known Allergies  FAMILY HISTORY: Family History  Problem Relation Age of Onset  . Anesthesia problems Neg Hx   . Hypotension Neg Hx   . Malignant hyperthermia Neg Hx   . Pseudochol deficiency Neg Hx   . Diabetes Father   . Stroke Father     SOCIAL HISTORY: History   Social History  . Marital Status: Married    Spouse Name: N/A    Number of Children:  N/A  . Years of Education: N/A   Occupational History  . Not on file.   Social History Main Topics  . Smoking status: Never Smoker   . Smokeless tobacco: Never Used  . Alcohol Use: Yes     Comment: occasionally  . Drug Use: No  . Sexual Activity: Yes    Partners: Male    Birth Control/ Protection: None   Other Topics Concern  . Not on file   Social History Narrative  . No narrative on file    REVIEW OF SYSTEMS: Constitutional: No fevers, chills, or sweats, no generalized fatigue, change in  appetite Eyes: No visual changes, double vision, eye pain Ear, nose and throat: Some nasal congestion and phlegm  Cardiovascular: No chest pain, palpitations Respiratory:  No shortness of breath at rest or with exertion, wheezes GastrointestinaI: No nausea, vomiting, diarrhea, abdominal pain, fecal incontinence Genitourinary:  No dysuria, urinary retention or frequency Musculoskeletal:  Neck pain Integumentary: No rash, pruritus, skin lesions Neurological: as above Psychiatric: No depression, insomnia, anxiety Endocrine: No palpitations, fatigue, diaphoresis, mood swings, change in appetite, change in weight, increased thirst Hematologic/Lymphatic:  No anemia, purpura, petechiae. Allergic/Immunologic: no itchy/runny eyes, nasal congestion, recent allergic reactions, rashes  PHYSICAL EXAM: Filed Vitals:   05/14/13 1056  BP: 118/70  Pulse: 66  Temp: 98.2 F (36.8 C)  Resp: 18   General: No acute distress Head:  Normocephalic/atraumatic Neck: supple, mild bilateral posterior tenderness, full range of motion Heart:  Regular rate and rhythm Lungs:  Clear to auscultation bilaterally Back: No paraspinal tenderness Neurological Exam: alert and oriented to person, place, and time. Attention span and concentration intact, recent and remote memory intact, fund of knowledge intact.  Speech fluent and not dysarthric, language intact.  CN II-XII intact. Fundoscopic exam unremarkable without vessel changes, exudates, hemorrhages or papilledema.  Bulk and tone normal, muscle strength 5/5 throughout.  Sensation to light touch, temperature and vibration intact.  Deep tendon reflexes 2+ throughout, toes downgoing.  Finger to nose and heel to shin testing intact.  Gait normal, Romberg negative.  IMPRESSION: Chronic migraine without aura.  Improved. Neck pain  PLAN: 1.  Increase nortriptyline to 25mg  at bedtime. 2.  Maxalt 10mg  for abortive therapy 3.  Tizanidine as needed for neck pain.   Instructed to use with caution regarding drowsiness (especially regarding driving) 4.  Stop caffeine 5.  Call in 4 weeks with update.  Follow up in 3 months.  Shon MilletAdam Jaffe, DO  CC:  Kara PuntScott Luking, MD

## 2013-05-21 ENCOUNTER — Ambulatory Visit: Payer: BC Managed Care – PPO | Admitting: Neurology

## 2013-06-11 ENCOUNTER — Telehealth: Payer: Self-pay | Admitting: Neurology

## 2013-06-11 NOTE — Telephone Encounter (Signed)
Pt called stating that she would like to try a higher dosage of NORTRIPTYLINE 25mg  one tablet at night.  Since she is still having headaches.

## 2013-06-12 ENCOUNTER — Telehealth: Payer: Self-pay | Admitting: *Deleted

## 2013-06-12 NOTE — Telephone Encounter (Signed)
Please advise 

## 2013-06-12 NOTE — Telephone Encounter (Signed)
Left message for patient to increase [name] to 50 mg HS adn to call to give up date in 4 weeks

## 2013-06-12 NOTE — Telephone Encounter (Signed)
Increase nortriptyline to 50mg  at bedtime and have her update [place] in another 4 weeks.

## 2013-06-13 ENCOUNTER — Other Ambulatory Visit: Payer: Self-pay | Admitting: Neurology

## 2013-06-14 MED ORDER — NORTRIPTYLINE HCL 50 MG PO CAPS: 50.0000 mg | ORAL_CAPSULE | Freq: Every day | ORAL | Status: DC

## 2013-06-14 NOTE — Telephone Encounter (Signed)
Per last note - patient to increase [name] to 50 mg daily. New RX sent to pharmacy.

## 2013-07-12 ENCOUNTER — Telehealth: Payer: Self-pay | Admitting: *Deleted

## 2013-07-12 NOTE — Telephone Encounter (Signed)
Patient called wanting to stop taking her [name] due to really bad mood swings . She says it has helped the headache some but not a lot . Please advise

## 2013-07-12 NOTE — Telephone Encounter (Signed)
Patient is wanting to know if there is anything else she can take with the Maxalt because this is really the only thing that seems to help the headache so she really does not want to stop taking the Maxalt . Please advise

## 2013-07-12 NOTE — Telephone Encounter (Signed)
I would stop the nortriptyline and we can try propranolol 40mg  twice daily.  Side effects may include lightheadedness or sleepiness because it is usually used to lower blood pressure.  If we start this, she will have to stop Maxalt because it cannot be taken with propranolol.  Instead, for abortive therapy, we can try sumatriptan 100mg  taken at earliest onset of the headache.  She can repeat in 2 hours once if needed.  She should not exceed more than 2 tablets in 24 hours.

## 2013-07-13 NOTE — Telephone Encounter (Signed)
Instead of propranolol, we can try venlafaxine ER.  I would start with 37.5mg  at bedtime for 7 days, then 75mg  at bedtime for 7 days, then 112.5mg  at bedtime for 7 days, then maintenance dose of 150mg  at bedtime.  Note that combination of antidepressants with bupropion may increase risk of seizures, but this is a low risk and not common (people are often on bupropion with other antidepressants for various reasons all the time).  She can remain on the Maxalt.

## 2013-07-16 NOTE — Telephone Encounter (Signed)
RX  Venlafaxine er called [facility]  Direction per Dr Everlena CooperJaffe

## 2013-07-23 ENCOUNTER — Telehealth: Payer: Self-pay | Admitting: Neurology

## 2013-07-23 ENCOUNTER — Other Ambulatory Visit: Payer: Self-pay | Admitting: Family Medicine

## 2013-07-23 MED ORDER — RIZATRIPTAN BENZOATE 10 MG PO TBDP: 10.0000 mg | ORAL_TABLET | ORAL | Status: DC | PRN

## 2013-07-23 NOTE — Telephone Encounter (Signed)
That will be fine.  Usually gets 9 pills, give 3 refills

## 2013-07-23 NOTE — Telephone Encounter (Signed)
Rx sent to patient's pharmacy. Pt was notified.

## 2013-07-23 NOTE — Telephone Encounter (Signed)
Pt called requesting a refill for  RIZATRIPTAN 10mg   But pt wants the actual tablet instead of the dissolving kind. Pharmacy: [facility] [place]   C/b [phone]

## 2013-07-23 NOTE — Telephone Encounter (Signed)
Please advise 

## 2013-08-14 ENCOUNTER — Ambulatory Visit: Payer: BC Managed Care – PPO | Admitting: Neurology

## 2013-08-21 ENCOUNTER — Ambulatory Visit (INDEPENDENT_AMBULATORY_CARE_PROVIDER_SITE_OTHER): Payer: BC Managed Care – PPO | Admitting: Neurology

## 2013-08-21 ENCOUNTER — Encounter: Payer: Self-pay | Admitting: Neurology

## 2013-08-21 VITALS — BP 106/78 | HR 70 | Resp 20 | Ht 64.0 in | Wt 124.6 lb

## 2013-08-21 DIAGNOSIS — G43709 Chronic migraine without aura, not intractable, without status migrainosus: Secondary | ICD-10-CM

## 2013-08-21 MED ORDER — VENLAFAXINE HCL ER 37.5 MG PO CP24: 37.5000 mg | ORAL_CAPSULE | Freq: Every day | ORAL | Status: DC

## 2013-08-21 NOTE — Progress Notes (Signed)
NEUROLOGY FOLLOW UP OFFICE NOTE  Kara Schwartz [id number]  HISTORY OF PRESENT ILLNESS: Kara Schwartz is a 28 year old right-handed woman who follows up for chronic migraine without aura and medication-overuse headache.  Records were personally reviewed.    UPDATE: Not as severe and without photophobia, phonophobia, osmophobia or nausea. Intensity:  8/10 Duration:  1-2 hours with Tylenol sinus (if persists, takes Maxalt which aborts it in 15-30 min). Frequency:  3 days per week (12 HA days per month)  Current abortive medication:  Maxalt 10mg , Tylenol sinus Current preventative medication:  Venlafaxine ER 150mg  at bedtime  Caffeine:  1 cup a day Stress/depression:  No, only regarding having sold the house and trying to find another place to live Sleep hygiene:  Good except when neck bothers her.  HISTORY: Onset:  On and off since 28 years old Location:  [place] (unilateral either side or bilateral) Quality:  sharp Initial intensity:  10/10; May 8-9/10 Aura:  no Associated symptoms:  Nausea (if severe), photophobia, phonophobia, osmophobia Initial duration:  Constant without medication (2-3 hours with medication); May 5-8 hours without meds (15-30 minutes with Maxalt) Initial frequency:  daily; May 4 days/week (16 HA days per month) Triggers/exacerbating factors:  none Relieving factors:  Ice pack, elastic head band Activity:  Force self to perform ADLs  Past abortive therapy:  Excedrin (helps), NSAIDs (sometimes helps); was taking almost daily. Past preventative therapy:  Verapamil 120mg  (ineffective, made lightheaded), topamax (initially helpful, weight loss, depression), bupropion (sleepy, ineffective), nortriptyline (mood swings)  Family history of headache:  Mother has headaches.  PAST MEDICAL HISTORY: Past Medical History  Diagnosis Date  . Headache(784.0)   . No pertinent past medical history   . Hypertriglyceridemia     MEDICATIONS: Current Outpatient  Prescriptions on File Prior to Visit  Medication Sig Dispense Refill  . acetaminophen (TYLENOL) 325 MG tablet Take 650 mg by mouth every 6 (six) hours as needed. For pain.       . fluticasone (FLONASE) 50 MCG/ACT nasal spray Place 2 sprays into the nose daily.  16 g  5  . promethazine (PHENERGAN) 12.5 MG tablet Take 1 tablet (12.5 mg total) by mouth every 6 (six) hours as needed for nausea or vomiting.  30 tablet  0  . rizatriptan (MAXALT-MLT) 10 MG disintegrating tablet Take 1 tablet (10 mg total) by mouth as needed for migraine. May repeat in 2 hours if needed  9 tablet  3  . tiZANidine (ZANAFLEX) 4 MG tablet Take 1 tablet (4 mg total) by mouth every 8 (eight) hours as needed.  30 tablet  1  . buPROPion (WELLBUTRIN SR) 150 MG 12 hr tablet Take 1 tablet (150 mg total) by mouth 2 (two) times daily.  60 tablet  5  . nortriptyline ([name]) 25 MG capsule TAKE ONE CAPSULE BY MOUTH AT BEDTIME  30 capsule  0  . nortriptyline ([name]) 50 MG capsule Take 1 capsule (50 mg total) by mouth at bedtime.  30 capsule  0  . verapamil (CALAN-SR) 120 MG CR tablet Take 1 tablet (120 mg total) by mouth daily.  30 tablet  11   No current facility-administered medications on file prior to visit.    ALLERGIES: No Known Allergies  FAMILY HISTORY: Family History  Problem Relation Age of Onset  . Anesthesia problems Neg Hx   . Hypotension Neg Hx   . Malignant hyperthermia Neg Hx   . Pseudochol deficiency Neg Hx   . Diabetes Father   .  Stroke Father     SOCIAL HISTORY: History   Social History  . Marital Status: Married    Spouse Name: N/A    Number of Children: N/A  . Years of Education: N/A   Occupational History  . Not on file.   Social History Main Topics  . Smoking status: Never Smoker   . Smokeless tobacco: Never Used  . Alcohol Use: Yes     Comment: occasionally  . Drug Use: No  . Sexual Activity: Yes    Partners: Male    Birth Control/ Protection: None   Other Topics Concern  . Not  on file   Social History Narrative  . No narrative on file    REVIEW OF SYSTEMS: Constitutional: No fevers, chills, or sweats, no generalized fatigue, change in appetite Eyes: No visual changes, double vision, eye pain Ear, nose and throat: No hearing loss, ear pain, nasal congestion, sore throat Cardiovascular: No chest pain, palpitations Respiratory:  No shortness of breath at rest or with exertion, wheezes GastrointestinaI: No nausea, vomiting, diarrhea, abdominal pain, fecal incontinence Genitourinary:  No dysuria, urinary retention or frequency Musculoskeletal:  No neck pain, back pain Integumentary: No rash, pruritus, skin lesions Neurological: as above Psychiatric: No depression, insomnia, anxiety Endocrine: No palpitations, fatigue, diaphoresis, mood swings, change in appetite, change in weight, increased thirst Hematologic/Lymphatic:  No anemia, purpura, petechiae. Allergic/Immunologic: no itchy/runny eyes, nasal congestion, recent allergic reactions, rashes  PHYSICAL EXAM: Filed Vitals:   08/21/13 1427  BP: 106/78  Pulse: 70  Resp: 20   General: No acute distress Head:  Normocephalic/atraumatic Neck: supple, no paraspinal tenderness, full range of motion Heart:  Regular rate and rhythm Lungs:  Clear to auscultation bilaterally Back: No paraspinal tenderness Neurological Exam: alert and oriented to person, place, and time. Attention span and concentration intact, recent and remote memory intact, fund of knowledge intact.  Speech fluent and not dysarthric, language intact.  CN II-XII intact. Fundoscopic exam unremarkable without vessel changes, exudates, hemorrhages or papilledema.  Bulk and tone normal, muscle strength 5/5 throughout.  Sensation to light touch, temperature and vibration intact.  Deep tendon reflexes 2+ throughout, toes downgoing.  Finger to nose and heel to shin testing intact.  Gait normal, Romberg negative.  IMPRESSION: Chronic migraine.  Seems to be  mildly improved on venlafaxine.  PLAN: 1.  Increase venlafaxine ER to 187.5mg  daily (150mg  capsule plus 37.5mg  capsule).  To call in 4 weeks with update. 2.  Maxalt and Tylenol sinus for abortive therapy 3.  Follow up in 3 months.  Shon MilletAdam Jaffe, DO  CC:  Lilyan PuntScott Luking, MD

## 2013-08-21 NOTE — Patient Instructions (Signed)
Take 37.5mg  capsule of Effexor with 150mg  capsule at breakfast daily.  Call in 4 weeks with update. Follow up in 3 months.

## 2013-09-26 ENCOUNTER — Telehealth: Payer: Self-pay | Admitting: Neurology

## 2013-09-26 NOTE — Telephone Encounter (Signed)
Pt called giving an update on ER-VENLAFAXINE 150/37.5 mg Update: Pt does not see any much of a change and is having a lot of headaches at night. Will have the headache throughout the night And will wake up with a headache. She wants to know how to stop taking the meds. If she can stop taking it all at once or? Please call pt [phone]

## 2013-09-28 NOTE — Telephone Encounter (Signed)
Patient states Venlafaxine ER is not helping with headache  She is having more headache at night time lasting through out the day . She would like to stop but is asking can she quit all at once ? Please advise

## 2013-09-28 NOTE — Telephone Encounter (Signed)
Left message for patient to return call to office. 

## 2013-09-28 NOTE — Telephone Encounter (Signed)
I would taper the venlafaxine by 37.5mg  every 3 days until off.  Instead, we can try Toprol XL  at bedtime.  It is a beta blocker often used for high blood pressure but also effective in migraine prevention.  Side effects may include sleepiness or lightheadedness.  She should call in 4 weeks with update

## 2013-10-02 ENCOUNTER — Other Ambulatory Visit: Payer: Self-pay | Admitting: *Deleted

## 2013-10-02 ENCOUNTER — Telehealth: Payer: Self-pay | Admitting: Neurology

## 2013-10-02 ENCOUNTER — Other Ambulatory Visit: Payer: Self-pay | Admitting: Neurology

## 2013-10-02 MED ORDER — METOPROLOL SUCCINATE ER 50 MG PO TB24: 50.0000 mg | ORAL_TABLET | Freq: Every day | ORAL | Status: DC

## 2013-10-02 NOTE — Telephone Encounter (Signed)
Pt needs to talk to someone about medication please call [phone]

## 2013-10-03 ENCOUNTER — Other Ambulatory Visit: Payer: Self-pay | Admitting: Neurology

## 2013-10-09 ENCOUNTER — Telehealth: Payer: Self-pay | Admitting: Neurology

## 2013-10-09 NOTE — Telephone Encounter (Signed)
RX called into pharmacy for venlafaxine xr 37.5 mg # 30  q 3rd day until all completed

## 2013-10-09 NOTE — Telephone Encounter (Signed)
Pt called requesting a refill for VENLAFAXINE 37.5MG  Pharmacy- [facility] in [place] ([id number]) [phone]

## 2013-11-05 ENCOUNTER — Encounter: Payer: Self-pay | Admitting: Neurology

## 2013-11-08 ENCOUNTER — Other Ambulatory Visit: Payer: Self-pay | Admitting: *Deleted

## 2013-11-08 ENCOUNTER — Telehealth: Payer: Self-pay | Admitting: *Deleted

## 2013-11-08 ENCOUNTER — Telehealth: Payer: Self-pay | Admitting: Neurology

## 2013-11-08 DIAGNOSIS — G43009 Migraine without aura, not intractable, without status migrainosus: Secondary | ICD-10-CM

## 2013-11-08 NOTE — Telephone Encounter (Signed)
Patient will stop toporol xl  She will pick up lab slip 11/09/13 am for CBC and CMP and then we will call in RX for Depakote ER if labs are normal

## 2013-11-08 NOTE — Telephone Encounter (Signed)
Pt called requesting to speak to a nurse regarding METOPROLOL ER 50mg . Pt had a migraine two days ago and is experiencing nausea and blurry vision. Pt is not sure if the migraine is causing her symptoms or the metoprolol. Please call back # [phone]

## 2013-11-08 NOTE — Telephone Encounter (Signed)
Patient called stating she thinks the toprol XL is causing her headache to worsen they have gotten worse since she started taking the medication . Over the past 3 days the headache  has been really bad with blurry vision nausea and black out areas when reading. She has moved her appt up Please advise

## 2013-11-08 NOTE — Telephone Encounter (Signed)
Stop Toprol.  We can start Depakote ER 500mg  daily. I want to check CBC and CMP first.

## 2013-11-09 LAB — CBC
HEMATOCRIT: 38.9 % (ref 36.0–46.0)
Hemoglobin: 13.6 g/dL (ref 12.0–15.0)
MCH: 30.8 pg (ref 26.0–34.0)
MCHC: 35 g/dL (ref 30.0–36.0)
MCV: 88 fL (ref 78.0–100.0)
Platelets: 258 10*3/uL (ref 150–400)
RBC: 4.42 MIL/uL (ref 3.87–5.11)
RDW: 12.7 % (ref 11.5–15.5)
WBC: 5.2 10*3/uL (ref 4.0–10.5)

## 2013-11-10 LAB — COMPREHENSIVE METABOLIC PANEL
ALT: <8 U/L (ref 0–35)
AST: 10 U/L (ref 0–37)
Albumin: 4.5 g/dL (ref 3.5–5.2)
Alkaline Phosphatase: 49 U/L (ref 39–117)
BILIRUBIN TOTAL: 0.7 mg/dL (ref 0.2–1.2)
BUN: 7 mg/dL (ref 6–23)
CO2: 27 mEq/L (ref 19–32)
Calcium: 9.3 mg/dL (ref 8.4–10.5)
Chloride: 101 mEq/L (ref 96–112)
Creat: 0.62 mg/dL (ref 0.50–1.10)
GLUCOSE: 90 mg/dL (ref 70–99)
Potassium: 3.9 mEq/L (ref 3.5–5.3)
Sodium: 140 mEq/L (ref 135–145)
Total Protein: 6.8 g/dL (ref 6.0–8.3)

## 2013-11-12 ENCOUNTER — Telehealth: Payer: Self-pay | Admitting: *Deleted

## 2013-11-12 NOTE — Telephone Encounter (Signed)
-----   Message from Cira ServantAdam Robert Jaffe, DO sent at 11/11/2013 12:52 PM EST ----- Lab work looks okay.  Start Depakote ER 500mg  daily ----- Message -----    From: Lab in [facility] Interface    Sent: 11/09/2013  10:59 PM      To: Cira ServantAdam Robert Jaffe, DO

## 2013-11-12 NOTE — Telephone Encounter (Signed)
Patient is aware of normal labs and will discuss new medication at office visit 11/14/13

## 2013-11-14 ENCOUNTER — Encounter: Payer: Self-pay | Admitting: Neurology

## 2013-11-14 ENCOUNTER — Ambulatory Visit (INDEPENDENT_AMBULATORY_CARE_PROVIDER_SITE_OTHER): Payer: BC Managed Care – PPO | Admitting: Neurology

## 2013-11-14 VITALS — BP 112/70 | HR 88 | Temp 97.0°F | Resp 16 | Wt 123.2 lb

## 2013-11-14 DIAGNOSIS — G43719 Chronic migraine without aura, intractable, without status migrainosus: Secondary | ICD-10-CM

## 2013-11-14 DIAGNOSIS — G4440 Drug-induced headache, not elsewhere classified, not intractable: Secondary | ICD-10-CM

## 2013-11-14 DIAGNOSIS — G4441 Drug-induced headache, not elsewhere classified, intractable: Secondary | ICD-10-CM

## 2013-11-14 MED ORDER — PROMETHAZINE HCL 12.5 MG PO TABS: 12.5000 mg | ORAL_TABLET | Freq: Four times a day (QID) | ORAL | Status: DC | PRN

## 2013-11-14 MED ORDER — AMBULATORY NON FORMULARY MEDICATION: Status: DC

## 2013-11-14 MED ORDER — PREDNISONE 10 MG PO TABS: ORAL_TABLET | ORAL | Status: DC

## 2013-11-14 MED ORDER — DIVALPROEX SODIUM ER 250 MG PO TB24: ORAL_TABLET | ORAL | Status: DC

## 2013-11-14 NOTE — Progress Notes (Addendum)
NEUROLOGY FOLLOW UP OFFICE NOTE  Kara Schwartz [id number]  HISTORY OF PRESENT ILLNJimmey RalphSS: Rueben BashVictoria Schwartz is a 28 year old right-handed woman who follows up for chronic migraine without aura.  UPDATE: She has had a significant worsening of headache intensity and frequency.  They are slightly changed, in that the pain feels like it is going through her nose as well as being bi-temporal.  It is also associated with visual disturbance, specifically seeing spots. Intensity:  10/10 Duration:  1 hour with Maxalt (otherwise all day) Frequency:  daily Current abortive medication:  Maxalt 10mg , Tylenol sinus Current preventative medication:  None (tapered off Toprol)  Caffeine:  1 cup a day Stress/depression:  No, only regarding having sold the house and trying to find another place to live Sleep hygiene:  Good except when neck bothers her.  11/08/13 LABS:  WBC 5.2, HGB 13.6, HCT 38.9, PLT 258, Na 140, K 3.9, Cl 101, CO2 27, glucose 90, BUN 7, Cr 0.62, TB 0.7, ALP 49, AST 10, ALT <8, TP 6.8.  HISTORY: Onset:  On and off since 28 years old Location:  [place] (unilateral either side or bilateral) Quality:  sharp Initial intensity:  10/10; August 8/10 Aura:  no Associated symptoms:  Nausea (if severe), photophobia, phonophobia, osmophobia (in August, these symptoms were not present). Initial duration:  Constant without medication (2-3 hours with medication); August 1-2 hours with Tylenol sinus (if persists, takes Maxalt which aborts it in 15-30 min). Initial frequency:  daily; August 3 days per week (12 HA days per month) Triggers/exacerbating factors:  none Relieving factors:  Ice pack, elastic head band Activity:  Force self to perform ADLs  Past abortive therapy:  Excedrin (helps), NSAIDs (sometimes helps); was taking almost daily. Past preventative therapy:  Verapamil 120mg  (ineffective, made lightheaded), topamax (initially helpful, weight loss, depression), bupropion (sleepy,  ineffective), nortriptyline (mood swings), venlafaxine ER 187.5mg  (ineffective), Toprol XL (side effects)  Family history of headache:  Mother has headaches.  PAST MEDICAL HISTORY: Past Medical History  Diagnosis Date  . Headache(784.0)   . No pertinent past medical history   . Hypertriglyceridemia     MEDICATIONS: Current Outpatient Prescriptions on File Prior to Visit  Medication Sig Dispense Refill  . acetaminophen (TYLENOL) 325 MG tablet Take 650 mg by mouth every 6 (six) hours as needed. For pain.     . nortriptyline ([name]) 25 MG capsule TAKE ONE CAPSULE BY MOUTH AT BEDTIME 30 capsule 0  . nortriptyline ([name]) 50 MG capsule Take 1 capsule (50 mg total) by mouth at bedtime. 30 capsule 0  . rizatriptan (MAXALT-MLT) 10 MG disintegrating tablet Take 1 tablet (10 mg total) by mouth as needed for migraine. May repeat in 2 hours if needed 9 tablet 3  . buPROPion (WELLBUTRIN SR) 150 MG 12 hr tablet Take 1 tablet (150 mg total) by mouth 2 (two) times daily. 60 tablet 5  . fluticasone (FLONASE) 50 MCG/ACT nasal spray Place 2 sprays into the nose daily. 16 g 5  . metoprolol succinate (TOPROL XL) 50 MG 24 hr tablet Take 1 tablet (50 mg total) by mouth daily. Take with or immediately following a meal. 30 tablet 2  . tiZANidine (ZANAFLEX) 4 MG tablet Take 1 tablet (4 mg total) by mouth every 8 (eight) hours as needed. 30 tablet 1  . venlafaxine XR (EFFEXOR XR) 37.5 MG 24 hr capsule Take 1 capsule (37.5 mg total) by mouth daily with breakfast. Take with the 150mg  capsule 30 capsule 0  .  venlafaxine XR (EFFEXOR-XR) 150 MG 24 hr capsule Take 150 mg by mouth daily with breakfast.    . verapamil (CALAN-SR) 120 MG CR tablet Take 1 tablet (120 mg total) by mouth daily. 30 tablet 11   No current facility-administered medications on file prior to visit.    ALLERGIES: No Known Allergies  FAMILY HISTORY: Family History  Problem Relation Age of Onset  . Anesthesia problems Neg Hx   .  Hypotension Neg Hx   . Malignant hyperthermia Neg Hx   . Pseudochol deficiency Neg Hx   . Diabetes Father   . Stroke Father     SOCIAL HISTORY: History   Social History  . Marital Status: Married    Spouse Name: N/A    Number of Children: N/A  . Years of Education: N/A   Occupational History  . Not on file.   Social History Main Topics  . Smoking status: Never Smoker   . Smokeless tobacco: Never Used  . Alcohol Use: Yes     Comment: occasionally  . Drug Use: No  . Sexual Activity:    Partners: Male    Birth Control/ Protection: None   Other Topics Concern  . Not on file   Social History Narrative    REVIEW OF SYSTEMS: Constitutional: No fevers, chills, or sweats, no generalized fatigue, change in appetite Eyes: No visual changes, double vision, eye pain Ear, nose and throat: No hearing loss, ear pain, nasal congestion, sore throat Cardiovascular: No chest pain, palpitations Respiratory:  No shortness of breath at rest or with exertion, wheezes GastrointestinaI: No nausea, vomiting, diarrhea, abdominal pain, fecal incontinence Genitourinary:  No dysuria, urinary retention or frequency Musculoskeletal:  No neck pain, back pain Integumentary: No rash, pruritus, skin lesions Neurological: as above Psychiatric: No depression, insomnia, anxiety Endocrine: No palpitations, fatigue, diaphoresis, mood swings, change in appetite, change in weight, increased thirst Hematologic/Lymphatic:  No anemia, purpura, petechiae. Allergic/Immunologic: no itchy/runny eyes, nasal congestion, recent allergic reactions, rashes  PHYSICAL EXAM: Filed Vitals:   11/14/13 1300  BP: 112/70  Pulse: 88  Temp: 97 F (36.1 C)  Resp: 16   General: No acute distress Head:  Normocephalic/atraumatic Eyes:  Fundoscopic exam unremarkable without vessel changes, exudates, hemorrhages or papilledema. Neck: supple, no paraspinal tenderness, full range of motion Heart:  Regular rate and  rhythm Lungs:  Clear to auscultation bilaterally Back: No paraspinal tenderness Neurological Exam: alert and oriented to person, place, and time. Attention span and concentration intact, recent and remote memory intact, fund of knowledge intact.  Speech fluent and not dysarthric, language intact.  CN II-XII intact. Fundoscopic exam unremarkable without vessel changes, exudates, hemorrhages or papilledema.  Bulk and tone normal, muscle strength 5/5 throughout.  Sensation to light touch, temperature and vibration intact.  Deep tendon reflexes 2+ throughout, toes downgoing.  Finger to nose and heel to shin testing intact.  Gait normal, Romberg negative.  IMPRESSION: Chronic migraine without aura Medication overuse headache  PLAN: 1.  Will initiate Depakote ER 250mg  daily x1 week, then 500mg  daily. 2.  Prednisone taper  3.  Maxalt with naproxen 500mg  for abortive therapy 4.  Taper off Tylenol to no more than 2 days out of the week 5.  Since she has had new symptoms and change in quality of headache with increased pain, will get MRI of the brain with and without contrast. 6.  We discussed the Cephaly band as a migraine prevention.  She is interested and we will set this up for her.  7.  Follow up in 4 weeks.  Shon MilletAdam Dennise Bamber, DO  CC:  Lilyan PuntScott Luking, MD

## 2013-11-14 NOTE — Patient Instructions (Addendum)
1.  Start depakote ER 250mg  daily for 1 week, then 500mg  (2 capsules) daily  2.  Take Take 6tabs x1day, then 5tabs x1day, then 4tabs x1day, then 3tabs x1day, then 2tabs x1day, then 1tab x1day, then STOP.  Do not take naproxen while on prednisone. 3.  For abortive therapy, take Maxalt with naproxen 500mg  at earliest onset of headache.  Do not exceed more than 2 days and out the week. 4.  Taper off Tylenol to no more than 6 days for a week, then no more than 5 days out of the week, then 4 days out of the week for a week, then 3 days out of the week, then no more than 2 days.  5.  We will get paper work started for the band. 6.  Follow up in 4 weeks. 7. MRI brian with and without  Jeani Hawkingnnie penn 11/19/13 3:30pm

## 2013-11-15 ENCOUNTER — Telehealth: Payer: Self-pay | Admitting: Neurology

## 2013-11-15 NOTE — Telephone Encounter (Signed)
Pt called regarding the Cefaly headband. Pt would like to speak to a nurse regarding ordering it. Please call pt # [phone]

## 2013-11-19 ENCOUNTER — Ambulatory Visit (HOSPITAL_COMMUNITY)
Admission: RE | Admit: 2013-11-19 | Discharge: 2013-11-19 | Disposition: A | Discharge status: Home / Self Care | Payer: BC Managed Care – PPO | Source: Ambulatory Visit | Attending: Neurology | Admitting: Neurology

## 2013-11-19 DIAGNOSIS — G43909 Migraine, unspecified, not intractable, without status migrainosus: Secondary | ICD-10-CM | POA: Diagnosis not present

## 2013-11-19 DIAGNOSIS — G43719 Chronic migraine without aura, intractable, without status migrainosus: Secondary | ICD-10-CM

## 2013-11-19 DIAGNOSIS — G4440 Drug-induced headache, not elsewhere classified, not intractable: Secondary | ICD-10-CM

## 2013-11-19 MED ORDER — GADOBENATE DIMEGLUMINE 529 MG/ML IV SOLN
10.0000 mL | Freq: Once | INTRAVENOUS | Status: AC | PRN
  Administered 2013-11-19: 10 mL via INTRAVENOUS

## 2013-11-20 ENCOUNTER — Telehealth: Payer: Self-pay | Admitting: *Deleted

## 2013-11-20 NOTE — Telephone Encounter (Signed)
-----   Message from Cira ServantAdam Robert Jaffe, DO sent at 11/20/2013  8:31 AM EST ----- Mri looks okay ----- Message -----    From: Rad Results In Interface    Sent: 11/19/2013   6:01 PM      To: Cira ServantAdam Robert Jaffe, DO

## 2013-11-20 NOTE — Telephone Encounter (Signed)
patient is aware of normal MRI Brain

## 2013-11-21 ENCOUNTER — Ambulatory Visit: Payer: BC Managed Care – PPO | Admitting: Neurology

## 2013-12-10 ENCOUNTER — Other Ambulatory Visit: Payer: Self-pay | Admitting: Neurology

## 2013-12-17 ENCOUNTER — Ambulatory Visit (INDEPENDENT_AMBULATORY_CARE_PROVIDER_SITE_OTHER): Payer: BC Managed Care – PPO | Admitting: Neurology

## 2013-12-17 ENCOUNTER — Encounter: Payer: Self-pay | Admitting: Neurology

## 2013-12-17 VITALS — BP 116/72 | HR 70 | Resp 16 | Ht 65.0 in | Wt 128.0 lb

## 2013-12-17 DIAGNOSIS — G43719 Chronic migraine without aura, intractable, without status migrainosus: Secondary | ICD-10-CM

## 2013-12-17 MED ORDER — DIVALPROEX SODIUM ER 250 MG PO TB24: 750.0000 mg | ORAL_TABLET | Freq: Every day | ORAL | Status: DC

## 2013-12-17 NOTE — Patient Instructions (Signed)
1.  Increase Depakote ER to 3 tablets (750mg ) daily 2.  Take folic acid 1mg  daily while on Depakote as discussed. 3.  Continue the cephaly 4.  Follow up in 2 months.

## 2013-12-17 NOTE — Progress Notes (Signed)
NEUROLOGY FOLLOW UP OFFICE NOTE  [name] [id number]  HISTORY OF PRESENT ILLNESS: Kara Schwartz is a 28 year old right-handed woman who follows up for chronic migraine without aura.  MRI personally reviewed.  UPDATE: Last visit, she reported a significant worsening of headache intensity and frequency.  They are slightly changed, in that the pain feels like it is going through her nose as well as being bi-temporal.  It is also associated with visual disturbance, specifically seeing spots.  Due to this change in her headache, an MRI of the brain with and without contrast was performed on 11/19/13, which was unremarkable.  She is doing much better over the past month.  She is also using the Cephaly band, which she feels is helpful and will help relieve the headache when it comes on.  Intensity:  usually 5-6/10.  She had only 2 severe headaches over the past 4 weeks. Duration:  1 hour Frequency:  19 headache days in last 4 weeks (only 2 severe headaches) Current abortive medication:  Maxalt 10mg  + naproxen 500mg  (takes infrequenty.  Only took Maxalt four times) Current preventative medication:  Depakote ER 500mg  daily, Cefaly  Caffeine:  1 cup a day Stress/depression:  No, only regarding having sold the house and trying to find another place to live Sleep hygiene:  Good except when neck bothers her.  11/08/13 LABS:  WBC 5.2, HGB 13.6, HCT 38.9, PLT 258, Na 140, K 3.9, Cl 101, CO2 27, glucose 90, BUN 7, Cr 0.62, TB 0.7, ALP 49, AST 10, ALT <8, TP 6.8.  HISTORY: Onset:  On and off since 28 years old Location:  [place] (unilateral either side or bilateral) Quality:  sharp Initial intensity:  10/10; November 10/10 Aura:  no Associated symptoms:  Nausea (if severe), photophobia, phonophobia, osmophobia (in August, these symptoms were not present). Initial duration:  Constant without medication (2-3 hours with medication); November 1 hour with Maxalt (otherwise all day) Initial  frequency:  daily; November daily Triggers/exacerbating factors:  none Relieving factors:  Ice pack, elastic head band Activity:  Force self to perform ADLs  Past abortive therapy:  Excedrin (helps), NSAIDs (sometimes helps); was taking almost daily. Past preventative therapy:  Verapamil 120mg  (ineffective, made lightheaded), topamax (initially helpful, weight loss, depression), bupropion (sleepy, ineffective), nortriptyline (mood swings), venlafaxine ER 187.5mg  (ineffective), Toprol XL (side effects)  Family history of headache:  Mother has headaches.  PAST MEDICAL HISTORY: Past Medical History  Diagnosis Date  . Headache(784.0)   . No pertinent past medical history   . Hypertriglyceridemia     MEDICATIONS: Current Outpatient Prescriptions on File Prior to Visit  Medication Sig Dispense Refill  . acetaminophen (TYLENOL) 325 MG tablet Take 650 mg by mouth every 6 (six) hours as needed. For pain.     . promethazine (PHENERGAN) 12.5 MG tablet Take 1 tablet (12.5 mg total) by mouth every 6 (six) hours as needed for nausea or vomiting. 30 tablet 0  . rizatriptan (MAXALT-MLT) 10 MG disintegrating tablet Take 1 tablet (10 mg total) by mouth as needed for migraine. May repeat in 2 hours if needed 9 tablet 3  . tiZANidine (ZANAFLEX) 4 MG tablet Take 1 tablet (4 mg total) by mouth every 8 (eight) hours as needed. 30 tablet 1  . buPROPion (WELLBUTRIN SR) 150 MG 12 hr tablet Take 1 tablet (150 mg total) by mouth 2 (two) times daily. 60 tablet 5   No current facility-administered medications on file prior to visit.  ALLERGIES: No Known Allergies  FAMILY HISTORY: Family History  Problem Relation Age of Onset  . Anesthesia problems Neg Hx   . Hypotension Neg Hx   . Malignant hyperthermia Neg Hx   . Pseudochol deficiency Neg Hx   . Diabetes Father   . Stroke Father     SOCIAL HISTORY: History   Social History  . Marital Status: Married    Spouse Name: N/A    Number of Children:  N/A  . Years of Education: N/A   Occupational History  . Not on file.   Social History Main Topics  . Smoking status: Never Smoker   . Smokeless tobacco: Never Used  . Alcohol Use: Yes     Comment: occasionally  . Drug Use: No  . Sexual Activity:    Partners: Male    Birth Control/ Protection: None   Other Topics Concern  . Not on file   Social History Narrative    REVIEW OF SYSTEMS: Constitutional: No fevers, chills, or sweats, no generalized fatigue, change in appetite Eyes: No visual changes, double vision, eye pain Ear, nose and throat: No hearing loss, ear pain, nasal congestion, sore throat Cardiovascular: No chest pain, palpitations Respiratory:  No shortness of breath at rest or with exertion, wheezes GastrointestinaI: No nausea, vomiting, diarrhea, abdominal pain, fecal incontinence Genitourinary:  No dysuria, urinary retention or frequency Musculoskeletal:  No neck pain, back pain Integumentary: No rash, pruritus, skin lesions Neurological: as above Psychiatric: No depression, insomnia, anxiety Endocrine: No palpitations, fatigue, diaphoresis, mood swings, change in appetite, change in weight, increased thirst Hematologic/Lymphatic:  No anemia, purpura, petechiae. Allergic/Immunologic: no itchy/runny eyes, nasal congestion, recent allergic reactions, rashes  PHYSICAL EXAM: Filed Vitals:   12/17/13 1323  BP: 116/72  Pulse: 70  Resp: 16   General: No acute distress Head:  Normocephalic/atraumatic  IMPRESSION: Chronic migraine without aura.  Improved.  PLAN: 1.  Will increase Depakote ER to 750mg  at bedtime.  Discussed the potential teratogenic effects of this medication.  She has no plan to have a baby at this time, but was advised to discuss with me first before ever trying to conceive.  Advised to start folic acid 1mg  daily.   2.  Continue using the Cefaly band. 3.  Continue naproxen 500mg  or Maxalt 10mg  with naproxen 500mg  for abortive therapy. 4.   Follow up in 2 months.  15 minutes spent with patient, 100% spent discussing medication side effects of Depakote and other management of care.  Shon MilletAdam Timmi Devora, DO  CC: Lilyan PuntScott Luking, MD

## 2014-03-04 ENCOUNTER — Ambulatory Visit: Payer: BC Managed Care – PPO | Admitting: Neurology

## 2014-03-20 ENCOUNTER — Ambulatory Visit (INDEPENDENT_AMBULATORY_CARE_PROVIDER_SITE_OTHER): Payer: BLUE CROSS/BLUE SHIELD | Admitting: Neurology

## 2014-03-20 ENCOUNTER — Encounter: Payer: Self-pay | Admitting: Neurology

## 2014-03-20 VITALS — BP 104/72 | HR 94 | Resp 20 | Ht 65.0 in | Wt 132.6 lb

## 2014-03-20 DIAGNOSIS — G43709 Chronic migraine without aura, not intractable, without status migrainosus: Secondary | ICD-10-CM

## 2014-03-20 MED ORDER — TOPIRAMATE 25 MG PO TABS
25.0000 mg | ORAL_TABLET | Freq: Every day | ORAL | Status: DC
Start: 2014-03-20 — End: 2014-08-14

## 2014-03-20 NOTE — Progress Notes (Signed)
NEUROLOGY FOLLOW UP OFFICE NOTE  Kara Schwartz [id number]  HISTORY OF PRESENT ILLNESS: Kara Schwartz is a 29 year old right-handed woman who follows up for chronic migraine without aura.    UPDATE: Improved, however the frequency has increased over the past month, possibly due to change in weather. Intensity:  6/10 Duration:  approximately 3 hours Frequency:  4-5 days per month, until this past month when it has been 12-16 days per month Current abortive medication:  naproxen, rarely uses Maxalt unless it is severe, Cefaly band Current preventative medication:  Depakote ER  daily, Cefaly band.  She feels both therapies are helpful.  However, she is concerned that the Depakote has caused some weight gain.  She has only gained 3 pounds over the past 3 months, however she reports that is because she is trying very hard to keep off the weight.  She would like to try topamax again.  She previously took it twice many years ago.  The first time, she stopped due to weight loss.  The second time, she stopped due to exacerbating depression, however she said she was going through a rough time which has since resolved.  She said it was effective.  Caffeine:  1 cup a day Stress/depression:  No, only regarding having sold the house and trying to find another place to live Sleep hygiene:  Good except when neck bothers her.  HISTORY: Onset:  On and off since 30 years old Location:  [place] (unilateral either side or bilateral) Quality:  sharp Initial intensity:  10/10; December 5-6/10 Aura:  no Associated symptoms:  Nausea (if severe), photophobia, phonophobia, osmophobia (in August, these symptoms were not present). Initial duration:  Constant without medication (2-3 hours with medication); December 1 hour Initial frequency:  daily; December 19 headache days in last 4 weeks (only 2 severe headaches) Triggers/exacerbating factors:  none Relieving factors:  Ice pack, elastic head  band Activity:  Force self to perform ADLs  Past abortive therapy:  Excedrin (helps), NSAIDs (sometimes helps); was taking almost daily. Past preventative therapy:  Verapamil  (ineffective, made lightheaded), topamax (initially helpful, weight loss, depression), bupropion (sleepy, ineffective), nortriptyline (mood swings), venlafaxine ER 187.5mg  (ineffective), Toprol XL (side effects)  Family history of headache:  Mother has headaches.  MRI of the brain with and without contrast was performed on 11/19/13, which was unremarkable. CBC and CMP from November was normal.  PAST MEDICAL HISTORY: Past Medical History  Diagnosis Date  . Headache(784.0)   . No pertinent past medical history   . Hypertriglyceridemia     MEDICATIONS: Current Outpatient Prescriptions on File Prior to Visit  Medication Sig Dispense Refill  . acetaminophen (TYLENOL) 325 MG tablet Take 650 mg by mouth every 6 (six) hours as needed. For pain.     . promethazine (PHENERGAN) 12.5 MG tablet Take 1 tablet (12.5 mg total) by mouth every 6 (six) hours as needed for nausea or vomiting. 30 tablet 0  . rizatriptan (MAXALT-MLT) 10 MG disintegrating tablet Take 1 tablet (10 mg total) by mouth as needed for migraine. May repeat in 2 hours if needed 9 tablet 3  . tiZANidine (ZANAFLEX) 4 MG tablet Take 1 tablet (4 mg total) by mouth every 8 (eight) hours as needed. 30 tablet 1   No current facility-administered medications on file prior to visit.    ALLERGIES: No Known Allergies  FAMILY HISTORY: Family History  Problem Relation Age of Onset  . Anesthesia problems Neg Hx   . Hypotension Neg  Hx   . Malignant hyperthermia Neg Hx   . Pseudochol deficiency Neg Hx   . Diabetes Father   . Stroke Father     SOCIAL HISTORY: History   Social History  . Marital Status: Married    Spouse Name: N/A  . Number of Children: N/A  . Years of Education: N/A   Occupational History  . Not on file.   Social History Main  Topics  . Smoking status: Never Smoker   . Smokeless tobacco: Never Used  . Alcohol Use: Yes     Comment: occasionally  . Drug Use: No  . Sexual Activity:    Partners: Male    Birth Control/ Protection: None   Other Topics Concern  . Not on file   Social History Narrative    REVIEW OF SYSTEMS: Constitutional: No fevers, chills, or sweats, no generalized fatigue, change in appetite Eyes: No visual changes, double vision, eye pain Ear, nose and throat: No hearing loss, ear pain, nasal congestion, sore throat Cardiovascular: No chest pain, palpitations Respiratory:  No shortness of breath at rest or with exertion, wheezes GastrointestinaI: No nausea, vomiting, diarrhea, abdominal pain, fecal incontinence Genitourinary:  No dysuria, urinary retention or frequency Musculoskeletal:  No neck pain, back pain Integumentary: No rash, pruritus, skin lesions Neurological: as above Psychiatric: No depression, insomnia, anxiety Endocrine: No palpitations, fatigue, diaphoresis, mood swings, change in appetite, change in weight, increased thirst Hematologic/Lymphatic:  No anemia, purpura, petechiae. Allergic/Immunologic: no itchy/runny eyes, nasal congestion, recent allergic reactions, rashes  PHYSICAL EXAM: Filed Vitals:   03/20/14 1107  BP: 104/72  Pulse: 94  Resp: 20   General: No acute distress Head:  Normocephalic/atraumatic Eyes:  Fundoscopic exam unremarkable without vessel changes, exudates, hemorrhages or papilledema. Neck: supple, no paraspinal tenderness, full range of motion Heart:  Regular rate and rhythm Lungs:  Clear to auscultation bilaterally Back: No paraspinal tenderness Neurological Exam: alert and oriented to person, place, and time. Attention span and concentration intact, recent and remote memory intact, fund of knowledge intact.  Speech fluent and not dysarthric, language intact.  CN II-XII intact. Fundoscopic exam unremarkable without vessel changes, exudates,  hemorrhages or papilledema.  Bulk and tone normal, muscle strength 5/5 throughout.  Sensation to light touch, temperature and vibration intact.  Deep tendon reflexes 2+ throughout.  Finger to nose testing intact.  Gait normal.  IMPRESSION: Chronic migraine without aura, not intractable, improved.  However, she would like to discontinue Depakote due to concerns of weight gain.  I explained that I would reconsider because she has been doing better and objectively there has not been any significant weight gain.  However, she would still like to retry topiramate again.  PLAN: 1.  Start topiramate 25mg  at bedtime.  Side effects discussed.  She is to call in 4 weeks with update. 2.  Will taper off of Depakote. 3.  Maxalt, naproxen or Cefaly for abortive therapy. 4.  Continue Cefaly band. 5.  Follow up in 3 months.  15 minutes spent with patient, over 50% spent discussing pros and cons regarding discontinuing Depakote, side effects of topiramate, and management.  Shon MilletAdam Tyress Loden, DO  CC:  Lilyan PuntScott Luking, MD

## 2014-03-20 NOTE — Patient Instructions (Signed)
1)  We will start topamax 25mg  at bedtime.  Possible side effects include: impaired thinking, sedation, paresthesias (numbness and tingling) and weight loss.  It may cause dehydration and there is a small risk for kidney stones, so make sure to stay hydrated with water during the day.  There is also a very small risk for glaucoma, so if you notice any change in your vision while taking this medication, see an ophthalmologist.  There is also a very small risk of possible suicidal ideation, as it the case with all antiepileptic medications.  Pregnancy Guidelines 1. If any medication changes are to be made, they should be completed several months before conception. Lamictal (lamotrigine) and Trileptal (oxcarbazepine) levels, in particular, need to carefully monitored (at least once a month) during pregnancy as drug levels for these two medications tend to decrease by at least 30% during pregnancy. 2. The risk of fetal malformation is increased in women taking seizure medications: However, the risk is lower in Topamax, when compared to older antiepileptic medications.  The risk is approximately 3% compared to 1-2% in the general population. 3. Women with epilepsy planning a pregnancy should take 5 mg/day of folic acid in the preconception period and throughout pregnancy. 4. Vitamin K (20 mg/day) should be used in the last month of pregnancy in women on enzyme-inducing seizure medications (such as Topamax).  Infants should receive 1 mg of vitamin K intramuscularly at birth, to prevent hemorrhagic disease of the newborn. 5. Overbreathing (hyperventilation), sleep deprivation, pain, and emotional stress increase the risk of seizures during labor.  Consider epidural anesthesia early in the labor. 6. All currently available seizure medications can be taken while breast feeding.  2)  We will taper off Depakote.  Take 2 pills at bedtime for 3 days, then 1 pill at bedtime for 3 days, then stop. 3)  Take Maxalt or  naproxen for headache.  Limit use of pain relievers to no more than 2 days out of the week. 4)  Continue with the Cefaly band. 5.  Follow up in 3 months but CALL IN 4 WEEKS WITH UPDATE

## 2014-04-25 ENCOUNTER — Telehealth: Payer: Self-pay | Admitting: Neurology

## 2014-04-25 NOTE — Telephone Encounter (Signed)
Increase topamax to 50mg  at bedtime.  Update in 4 weeks.

## 2014-04-25 NOTE — Telephone Encounter (Signed)
Pt states that the topamax is doing ok but may need to up dosage   Please advise

## 2014-04-25 NOTE — Telephone Encounter (Signed)
Pt states that the topamax is doing ok but may need to up dosage please call [phone]

## 2014-04-26 ENCOUNTER — Other Ambulatory Visit: Payer: Self-pay | Admitting: *Deleted

## 2014-04-26 MED ORDER — TOPIRAMATE 25 MG PO TABS
ORAL_TABLET | ORAL | Status: DC
Start: 2014-04-26 — End: 2014-08-14

## 2014-04-26 NOTE — Telephone Encounter (Signed)
Patient is aware to Increase topamax to 50mg  at bedtime. Update in 4 weeks. And RX was sent to pharmacy

## 2014-05-15 ENCOUNTER — Ambulatory Visit: Payer: Self-pay | Admitting: Neurology

## 2014-05-28 ENCOUNTER — Telehealth: Payer: Self-pay | Admitting: *Deleted

## 2014-05-28 NOTE — Telephone Encounter (Signed)
Please advise on below  

## 2014-05-28 NOTE — Telephone Encounter (Signed)
Patient upped her dose of Topamax to 50mg  and she is going fine should she increase more please advise Call back number [phone]

## 2014-06-06 ENCOUNTER — Telehealth: Payer: Self-pay | Admitting: Neurology

## 2014-06-06 NOTE — Telephone Encounter (Signed)
Pt called in regards to her medication Topiramate, said she is doing well and wondered if the dosage should be upped/Dawn [phone]

## 2014-06-06 NOTE — Telephone Encounter (Signed)
Patient is aware  If she is doing well, we don't need to increase it.

## 2014-06-06 NOTE — Telephone Encounter (Signed)
If she is doing well, we don't need to increase it.

## 2014-06-06 NOTE — Telephone Encounter (Signed)
Please advise         Pt called in regards to her medication Topiramate, said she is doing well and wondered if the dosage should be upped

## 2014-06-07 ENCOUNTER — Other Ambulatory Visit: Payer: Self-pay | Admitting: Neurology

## 2014-06-07 NOTE — Telephone Encounter (Signed)
Maxalt refill requested. Per last office note- patient to remain on medication. Refill approved and sent to patient's pharmacy.

## 2014-07-09 ENCOUNTER — Ambulatory Visit: Payer: BLUE CROSS/BLUE SHIELD | Admitting: Neurology

## 2014-07-10 ENCOUNTER — Other Ambulatory Visit: Payer: Self-pay | Admitting: Neurology

## 2014-07-15 ENCOUNTER — Other Ambulatory Visit: Payer: Self-pay | Admitting: *Deleted

## 2014-07-15 MED ORDER — RIZATRIPTAN BENZOATE 10 MG PO TBDP: ORAL_TABLET | ORAL | Status: DC

## 2014-08-12 ENCOUNTER — Ambulatory Visit: Payer: BLUE CROSS/BLUE SHIELD | Admitting: Neurology

## 2014-08-14 ENCOUNTER — Encounter: Payer: Self-pay | Admitting: Neurology

## 2014-08-14 ENCOUNTER — Ambulatory Visit (INDEPENDENT_AMBULATORY_CARE_PROVIDER_SITE_OTHER): Payer: BLUE CROSS/BLUE SHIELD | Admitting: Neurology

## 2014-08-14 VITALS — BP 116/70 | HR 60 | Resp 16 | Ht 65.0 in | Wt 122.6 lb

## 2014-08-14 DIAGNOSIS — G43009 Migraine without aura, not intractable, without status migrainosus: Secondary | ICD-10-CM

## 2014-08-14 MED ORDER — TOPIRAMATE 25 MG PO TABS: 75.0000 mg | ORAL_TABLET | Freq: Every day | ORAL | Status: DC

## 2014-08-14 NOTE — Progress Notes (Signed)
NEUROLOGY FOLLOW UP OFFICE NOTE  EMRI SAMPLE [id number]  HISTORY OF PRESENT ILLNESS: Kara Schwartz is a 29 year old right-handed woman who follows up for chronic migraine without aura.    UPDATE: Improved Intensity:  5/10 Duration:  approximately 1 hour Frequency:  8 headaches per month (2-3 headaches severe).  Only one migraine with nausea since March.  No photophobia, phonophobia or osmophobia Current abortive medication:  Cefaly band, Maxalt if sevefe Current preventative medication:  topiramate 50mg , Cefaly band daily  Stress/depression:  No, only regarding having sold the house and trying to find another place to live Sleep hygiene:  Good except when neck bothers her.  HISTORY: Onset:  On and off since 29 years old Location:  Temples (unilateral either side or bilateral) Quality:  sharp Initial intensity:  10/10; March 6/10 Aura:  no Associated symptoms:  Nausea (if severe), photophobia, phonophobia, osmophobia  Initial duration:  Constant without medication (2-3 hours with medication); March approximately 3 hours Initial frequency:  daily; March 12-16 days per month Triggers/exacerbating factors:  none Relieving factors:  Ice pack, elastic head band Activity:  Force self to perform ADLs  Past abortive therapy:  Excedrin (helps), NSAIDs (sometimes helps); was taking almost daily. Past preventative therapy:  Verapamil 120mg  (ineffective, made lightheaded), topamax (initially helpful, weight loss, depression), bupropion (sleepy, ineffective), nortriptyline (mood swings), venlafaxine ER 187.5mg  (ineffective), Toprol XL (side effects), Depakote ER 750mg   Family history of headache:  Mother has headaches.  PAST MEDICAL HISTORY: Past Medical History  Diagnosis Date  . Headache(784.0)   . No pertinent past medical history   . Hypertriglyceridemia     MEDICATIONS: Current Outpatient Prescriptions on File Prior to Visit  Medication Sig Dispense Refill  .  promethazine (PHENERGAN) 12.5 MG tablet Take 1 tablet (12.5 mg total) by mouth every 6 (six) hours as needed for nausea or vomiting. 30 tablet 0  . rizatriptan (MAXALT-MLT) 10 MG disintegrating tablet DISSOLVE ONE TABLET BY MOUTH AS NEEDED FOR MIGRAINE, MAY REPEAT IN 2 HOURS AS NEEDED 10 tablet 1  . tiZANidine (ZANAFLEX) 4 MG tablet TAKE 1 TABLET BY MOUTH EVERY 8 HOURS AS NEEDED 30 tablet 0  . acetaminophen (TYLENOL) 325 MG tablet Take 650 mg by mouth every 6 (six) hours as needed. For pain.      No current facility-administered medications on file prior to visit.    ALLERGIES: No Known Allergies  FAMILY HISTORY: Family History  Problem Relation Age of Onset  . Anesthesia problems Neg Hx   . Hypotension Neg Hx   . Malignant hyperthermia Neg Hx   . Pseudochol deficiency Neg Hx   . Diabetes Father   . Stroke Father     SOCIAL HISTORY: Social History   Social History  . Marital Status: Married    Spouse Name: N/A  . Number of Children: N/A  . Years of Education: N/A   Occupational History  . Not on file.   Social History Main Topics  . Smoking status: Never Smoker   . Smokeless tobacco: Never Used  . Alcohol Use: Yes     Comment: occasionally  . Drug Use: No  . Sexual Activity:    Partners: Male    Birth Control/ Protection: None   Other Topics Concern  . Not on file   Social History Narrative    REVIEW OF SYSTEMS: Constitutional: No fevers, chills, or sweats, no generalized fatigue, change in appetite Eyes: No visual changes, double vision, eye pain Ear, nose and throat: She  has a scratch in her throat. Cardiovascular: No chest pain, palpitations Respiratory:  No shortness of breath at rest or with exertion, wheezes GastrointestinaI: No nausea, vomiting, diarrhea, abdominal pain, fecal incontinence Genitourinary:  No dysuria, urinary retention or frequency Musculoskeletal:  No neck pain, back pain Integumentary: No rash, pruritus, skin lesions Neurological:  as above Psychiatric: No depression, insomnia, anxiety Endocrine: No palpitations, fatigue, diaphoresis, mood swings, change in appetite, change in weight, increased thirst Hematologic/Lymphatic:  No anemia, purpura, petechiae. Allergic/Immunologic: no itchy/runny eyes, nasal congestion, recent allergic reactions, rashes  PHYSICAL EXAM: Filed Vitals:   08/14/14 1524  BP: 116/70  Pulse: 60  Resp: 16   General: No acute distress.  Patient appears well-groomed.  normal body habitus. Head:  Normocephalic/atraumatic Eyes:  Fundoscopic exam unremarkable without vessel changes, exudates, hemorrhages or papilledema. Neck: supple, no paraspinal tenderness, full range of motion Heart:  Regular rate and rhythm Lungs:  Clear to auscultation bilaterally Back: No paraspinal tenderness Neurological Exam: alert and oriented to person, place, and time. Attention span and concentration intact, recent and remote memory intact, fund of knowledge intact.  Speech fluent and not dysarthric, language intact.  CN II-XII intact. Fundoscopic exam unremarkable without vessel changes, exudates, hemorrhages or papilledema.  Bulk and tone normal, muscle strength 5/5 throughout.  Sensation to light touch, temperature and vibration intact.  Deep tendon reflexes 2+ throughout, toes downgoing.  Finger to nose and heel to shin testing intact.  Gait normal, Romberg negative.  IMPRESSION: Migraine without aura  PLAN: 1.  Will try to reduce frequency of headaches a little more by increasing topiramate to  daily.  Advised to take  folic acid daily.  She will call in 2 months with update. 2.  Cefaly band daily and as needed.  Maxalt as needed 3.  Follow up in 6 months.  15 minutes spent face to face with patient, over 50% spent discussing management.  Shon Millet, DO  CC:  Lilyan Punt, MD

## 2014-08-14 NOTE — Patient Instructions (Signed)
1.  Increase topiramate to  at bedtime (take three  tablets).  Also take folic acid  daily.  Call in 2 months with update to see if the headaches are less frequent on the increased dose of topiramate. 2.  Use Cefaly daily and as needed.  You have Maxalt on-hand if needed. 3.  Follow up in 6 months.

## 2014-09-16 ENCOUNTER — Other Ambulatory Visit: Payer: Self-pay | Admitting: Neurology

## 2014-10-23 ENCOUNTER — Other Ambulatory Visit: Payer: Self-pay | Admitting: Neurology

## 2014-10-23 DIAGNOSIS — G43009 Migraine without aura, not intractable, without status migrainosus: Secondary | ICD-10-CM

## 2014-10-24 ENCOUNTER — Other Ambulatory Visit: Payer: Self-pay | Admitting: Neurology

## 2014-10-24 ENCOUNTER — Encounter: Payer: Self-pay | Admitting: Family Medicine

## 2014-10-24 ENCOUNTER — Ambulatory Visit (INDEPENDENT_AMBULATORY_CARE_PROVIDER_SITE_OTHER): Payer: BLUE CROSS/BLUE SHIELD | Admitting: Family Medicine

## 2014-10-24 VITALS — Temp 98.2°F | Ht 65.0 in | Wt 121.8 lb

## 2014-10-24 DIAGNOSIS — J329 Chronic sinusitis, unspecified: Secondary | ICD-10-CM | POA: Diagnosis not present

## 2014-10-24 DIAGNOSIS — G43009 Migraine without aura, not intractable, without status migrainosus: Secondary | ICD-10-CM

## 2014-10-24 MED ORDER — AZITHROMYCIN 250 MG PO TABS: ORAL_TABLET | ORAL | Status: DC

## 2014-10-24 NOTE — Progress Notes (Signed)
   Subjective:    Patient ID: Kara Schwartz, female    DOB: 11-03-1985, 29 y.o.   MRN: [id number]  Cough This is a new problem. The current episode started in the past 7 days. Associated symptoms include nasal congestion and a sore throat. Treatments tried: mucinex.   .sal Son brought this home  In throat and now has moved into the chest  Throat and chest occas productive  Night also  guafensisn bid prn     No cough b Review of Systems  HENT: Positive for sore throat.   Respiratory: Positive for cough.        Objective:   Physical Exam Alert moderate malaise. Vitals stable. HEENT moderate nasal congestion frontal tenderness pharynx slight erythema neck supple lungs bronchial cough no wheezes heart regular in rhythm.       Assessment & Plan:  Impression post viral rhinosinusitis/bronchitis plan antibiotics prescribed. Symptom care discussed warning signs discussed WSL

## 2014-10-24 NOTE — Telephone Encounter (Signed)
Last refill done on 09/16/14 for 10 tablets.   Last OV: 08/14/14

## 2014-10-24 NOTE — Telephone Encounter (Signed)
PT called in regards to her medication Rizatriptan called in and she would like a call back at [phone]/Dawn

## 2014-10-24 NOTE — Telephone Encounter (Signed)
Left message for pt to return call.

## 2014-10-25 NOTE — Telephone Encounter (Signed)
Attempted to reach patient x 1. 

## 2014-10-25 NOTE — Telephone Encounter (Signed)
She was started on antibiotics by her PCP yesterday and should complete the course to treat underlying URI before any changes to her topamax are made.  Continue topamax 25 BID as headaches may improve after illness clears.

## 2014-10-25 NOTE — Telephone Encounter (Signed)
Pt wanted provider to know that the increased dose to Topamax (25 mg TID) did help with headaches, however, it also caused constipation. She was having to take stool softener daily. Unsure if it is safe to take softener daily. Was using off brand of Miralax. Pt backed Topamax down to 25 mg BID. Constipation resolved. Unsure if dosage is helpful for headaches as she has had a URI for the last three weeks. Is continuing to have headaches but unsure if its her normal headaches or due to illness. Please advise.   Pt needs a refill on her Rizatriptan. RX was sent in yesterday.

## 2014-11-01 ENCOUNTER — Other Ambulatory Visit: Payer: Self-pay | Admitting: Nurse Practitioner

## 2014-11-01 ENCOUNTER — Telehealth: Payer: Self-pay | Admitting: Family Medicine

## 2014-11-01 MED ORDER — AMOXICILLIN-POT CLAVULANATE 875-125 MG PO TABS: 1.0000 | ORAL_TABLET | Freq: Two times a day (BID) | ORAL | Status: DC

## 2014-11-01 NOTE — Telephone Encounter (Signed)
LMRC

## 2014-11-01 NOTE — Telephone Encounter (Signed)
Spoke with patient and patient stated that she is still experiencing  Chest congestion and head congestion, nausea and headache, Afebrile . Patient stated that she has finished Z pak and would like to have something else for Congestion because its just not clearing up.

## 2014-11-01 NOTE — Telephone Encounter (Signed)
NTC

## 2014-11-01 NOTE — Telephone Encounter (Signed)
Called patient and informed her per Nathaneil Canaryarolyn Hoskins,NP- Office visit if no better. Patient verbalized understanding and was sent to front desk to schedule appointment for next week. Urged patient if symptoms worsen over the weekend to visit ER.

## 2014-11-01 NOTE — Telephone Encounter (Signed)
Office visit if no better.

## 2014-11-01 NOTE — Telephone Encounter (Signed)
Pt seen on the 20th by Dr Brett CanalesSteve and wanted to know if we could  Call in another round of antibiotic or prednisone to try an help knock  Out the congestion.   Symptoms still linger with heavy congestion in her head, productive Cough, no fever, still eating an drinking well, just not better  wal greens

## 2014-11-04 ENCOUNTER — Ambulatory Visit: Payer: BLUE CROSS/BLUE SHIELD | Admitting: Family Medicine

## 2014-11-06 ENCOUNTER — Ambulatory Visit (INDEPENDENT_AMBULATORY_CARE_PROVIDER_SITE_OTHER): Payer: BLUE CROSS/BLUE SHIELD | Admitting: Family Medicine

## 2014-11-06 VITALS — Temp 97.9°F | Ht 65.0 in | Wt 121.0 lb

## 2014-11-06 DIAGNOSIS — J329 Chronic sinusitis, unspecified: Secondary | ICD-10-CM

## 2014-11-06 MED ORDER — HYDROCODONE-ACETAMINOPHEN 5-325 MG PO TABS
1.0000 | ORAL_TABLET | Freq: Four times a day (QID) | ORAL | Status: DC | PRN
Start: 2014-11-06 — End: 2015-02-25

## 2014-11-06 MED ORDER — LEVOFLOXACIN 500 MG PO TABS: 500.0000 mg | ORAL_TABLET | Freq: Every day | ORAL | Status: DC

## 2014-11-06 NOTE — Progress Notes (Signed)
   Subjective:    Patient ID: Kara Schwartz, female    DOB: March 05, 1985, 29 y.o.   MRN: [id number]  Sinus Problem This is a new problem. The current episode started 1 to 4 weeks ago. Associated symptoms include congestion, coughing and headaches. Pertinent negatives include no ear pain or shortness of breath. Treatments tried: augmentin, decongestant.    patient was seen for initial viral illness with secondary sinusitis treated with antibiotics got better for a couple days that she got worse with sinus pressure and congestion drainage coughing not good denies wheezing or difficulty breathing now she states is primarily sinus pain discomfort.   Review of Systems  Constitutional: Negative for fever and activity change.  HENT: Positive for congestion and rhinorrhea. Negative for ear pain.   Eyes: Negative for discharge.  Respiratory: Positive for cough. Negative for shortness of breath and wheezing.   Cardiovascular: Negative for chest pain.  Neurological: Positive for headaches.       Objective:   Physical Exam  Constitutional: She appears well-developed.  HENT:  Head: Normocephalic.  Nose: Nose normal.  Mouth/Throat: Oropharynx is clear and moist. No oropharyngeal exudate.  Neck: Neck supple.  Cardiovascular: Normal rate and normal heart sounds.   No murmur heard. Pulmonary/Chest: Effort normal and breath sounds normal. She has no wheezes.  Lymphadenopathy:    She has no cervical adenopathy.  Skin: Skin is warm and dry.  Nursing note and vitals reviewed.         Assessment & Plan:   persistent sinusitis change antibiotics to Levaquin benefits outweigh the risks 1 daily today ongoing troubles may need to consider ENT evaluation of sinus openings patient name use decongestant and decongestant nasal spray saline sprays and saline washes follow-up if ongoing troubles if not getting better over the next week call we can help set up ENT warning signs discussed.

## 2014-11-07 ENCOUNTER — Ambulatory Visit: Payer: BLUE CROSS/BLUE SHIELD

## 2014-11-15 ENCOUNTER — Ambulatory Visit (INDEPENDENT_AMBULATORY_CARE_PROVIDER_SITE_OTHER): Payer: BLUE CROSS/BLUE SHIELD | Admitting: Nurse Practitioner

## 2014-11-15 ENCOUNTER — Ambulatory Visit (HOSPITAL_COMMUNITY)
Admission: RE | Admit: 2014-11-15 | Discharge: 2014-11-15 | Disposition: A | Discharge status: Home / Self Care | Payer: BLUE CROSS/BLUE SHIELD | Source: Ambulatory Visit | Attending: Nurse Practitioner | Admitting: Nurse Practitioner

## 2014-11-15 VITALS — BP 112/74 | Temp 98.4°F | Ht 65.0 in | Wt 122.0 lb

## 2014-11-15 DIAGNOSIS — R0789 Other chest pain: Secondary | ICD-10-CM

## 2014-11-15 DIAGNOSIS — M940 Chondrocostal junction syndrome [Tietze]: Secondary | ICD-10-CM

## 2014-11-15 MED ORDER — DICLOFENAC SODIUM 75 MG PO TBEC: 75.0000 mg | DELAYED_RELEASE_TABLET | Freq: Two times a day (BID) | ORAL | Status: DC

## 2014-11-15 NOTE — Patient Instructions (Signed)

## 2014-11-18 ENCOUNTER — Encounter: Payer: Self-pay | Admitting: Nurse Practitioner

## 2014-11-18 NOTE — Progress Notes (Signed)
Subjective:  Presents for c/o localized pain right anterior chest wall just beneath right breast. Started on 11/1/. Pain was not there on 11/2 so she did not mention during her last office visit. The pain started back on 11/3 and has been persistent ever since. Worse with coughing laying down in certain positions. No rash. No fever. Minimal cough at this point. No shortness of breath. No edema.  Objective:   BP 112/74 mmHg  Temp(Src) 98.4 F (36.9 C) (Oral)  Ht 5\' 5"  (1.651 m)  Wt 122 lb (55.339 kg)  BMI 20.30 kg/m2 NAD. Alert, oriented. Lungs minimal scattered expiratory crackles, no wheezing or tachypnea. Heart regular rate rhythm. Distinct localized area of tenderness noted right anterior chest wall just beneath the right breast. Skin clear.  Assessment: Costochondritis - Plan: DG Chest 2 View  Right-sided chest wall pain - Plan: DG Chest 2 View  Plan:  Meds ordered this encounter  Medications  . diclofenac (VOLTAREN) 75 MG EC tablet    Sig: Take 1 tablet (75 mg total) by mouth 2 (two) times daily. Prn pain    Dispense:  30 tablet    Refill:  0    Order Specific Question:  Supervising Provider    Answer:  Merlyn AlbertLUKING, WILLIAM S [[id number]   Chest x-ray pending. Start anti-inflammatories directed. Ice/heat as directed. Call back in 10-14 days if no improvement, sooner if worse.

## 2014-11-19 ENCOUNTER — Other Ambulatory Visit: Payer: Self-pay | Admitting: *Deleted

## 2014-11-19 ENCOUNTER — Telehealth: Payer: Self-pay | Admitting: Family Medicine

## 2014-11-19 MED ORDER — FLUCONAZOLE 150 MG PO TABS: ORAL_TABLET | ORAL | Status: DC

## 2014-11-19 NOTE — Telephone Encounter (Signed)
White discharge and itching. Diflucan 150mg  #2 one po 3 days apart. Follow up in office if not better. Pt verbalized understanding.

## 2014-11-19 NOTE — Telephone Encounter (Signed)
wal greens  Pt calling to request diflucan x2 for yeast infection She has been on several antibiotics an now has one

## 2014-12-03 ENCOUNTER — Other Ambulatory Visit: Payer: Self-pay | Admitting: Neurology

## 2014-12-03 NOTE — Telephone Encounter (Signed)
Last OV: 8/10/166 Next OV: 02/25/15

## 2014-12-04 ENCOUNTER — Ambulatory Visit (INDEPENDENT_AMBULATORY_CARE_PROVIDER_SITE_OTHER): Payer: BLUE CROSS/BLUE SHIELD

## 2014-12-04 DIAGNOSIS — Z23 Encounter for immunization: Secondary | ICD-10-CM | POA: Diagnosis not present

## 2015-02-18 ENCOUNTER — Ambulatory Visit: Payer: BLUE CROSS/BLUE SHIELD | Admitting: Neurology

## 2015-02-25 ENCOUNTER — Encounter: Payer: Self-pay | Admitting: Neurology

## 2015-02-25 ENCOUNTER — Ambulatory Visit (INDEPENDENT_AMBULATORY_CARE_PROVIDER_SITE_OTHER): Payer: BLUE CROSS/BLUE SHIELD | Admitting: Neurology

## 2015-02-25 VITALS — BP 116/70 | HR 70 | Ht 64.0 in | Wt 135.0 lb

## 2015-02-25 DIAGNOSIS — G43009 Migraine without aura, not intractable, without status migrainosus: Secondary | ICD-10-CM

## 2015-02-25 MED ORDER — ATENOLOL 25 MG PO TABS: 25.0000 mg | ORAL_TABLET | Freq: Every day | ORAL | Status: DC

## 2015-02-25 NOTE — Patient Instructions (Signed)
1.  Start atenolol  daily.  Call in 4 weeks with update.  If headaches not better, we can increase dose. 2.  Cefaly band daily and as needed 3.  Maxalt as needed 4.  Follow up in 6 months.

## 2015-02-25 NOTE — Progress Notes (Signed)
NEUROLOGY FOLLOW UP OFFICE NOTE  STEPHAINE BRESHEARS [id number]  HISTORY OF PRESENT ILLNESS: Kara Schwartz is a 30 year old right-handed woman who follows up for chronic migraine without aura.    UPDATE: Overall, stable but notes mild increase in headaches which she feels is related to sinus. She one severe headache a week, usually when she wakes up in bed.  It resolves within 30 minutes with Maxalt. She has dull headache about 2 days a week. Current abortive medication:  Cefaly band, Maxalt if severe Current preventative medication:  Cefaly band daily. Stopped topiramate because of side effects and she has not noticed a difference since discontinuation 2 months ago.  Stress/depression:  No, only regarding having sold the house and trying to find another place to live Sleep hygiene:  Good except when neck bothers her.  HISTORY: Onset:  On and off since 30 years old Location:  Temples (unilateral either side or bilateral) Quality:  sharp Initial intensity:  10/10; August 5/10 Aura:  no Associated symptoms:  Nausea (if severe), photophobia, phonophobia, osmophobia   Initial duration:  Constant without medication (2-3 hours with medication); August 1 hour Initial frequency:  daily; August 8 headaches per month (2-3 headaches severe).   Triggers/exacerbating factors:  none Relieving factors:  Ice pack, elastic head band Activity:  Force self to perform ADLs  Past abortive therapy:  Excedrin (helps), NSAIDs (sometimes helps); was taking almost daily. Past preventative therapy:  Verapamil  (ineffective, made lightheaded), topamax (initially helpful, weight loss, depression), bupropion (sleepy, ineffective), nortriptyline (mood swings), venlafaxine ER 187.5mg  (ineffective), Toprol XL (side effects), Depakote ER   Family history of headache:  Mother has headaches. PAST MEDICAL HISTORY: Past Medical History  Diagnosis Date  . Headache(784.0)   . No pertinent past medical  history   . Hypertriglyceridemia     MEDICATIONS: Current Outpatient Prescriptions on File Prior to Visit  Medication Sig Dispense Refill  . acetaminophen (TYLENOL) 325 MG tablet Take 650 mg by mouth every 6 (six) hours as needed. For pain.     Marland Kitchen diclofenac (VOLTAREN) 75 MG EC tablet Take 1 tablet (75 mg total) by mouth 2 (two) times daily. Prn pain 30 tablet 0  . rizatriptan (MAXALT-MLT) 10 MG disintegrating tablet DISSOLVE 1 TABLET BY MOUTH AS NEEDED FOR MIGRAINE; MAY REPEAT IN 2 HOURS AS NEEDED 10 tablet 3  . tiZANidine (ZANAFLEX) 4 MG tablet TAKE 1 TABLET BY MOUTH EVERY 8 HOURS AS NEEDED 30 tablet 0  . topiramate (TOPAMAX) 25 MG tablet Take 3 tablets (75 mg total) by mouth at bedtime. (Patient not taking: Reported on 02/25/2015) 90 tablet 3   No current facility-administered medications on file prior to visit.    ALLERGIES: No Known Allergies  FAMILY HISTORY: Family History  Problem Relation Age of Onset  . Anesthesia problems Neg Hx   . Hypotension Neg Hx   . Malignant hyperthermia Neg Hx   . Pseudochol deficiency Neg Hx   . Diabetes Father   . Stroke Father     SOCIAL HISTORY: Social History   Social History  . Marital Status: Married    Spouse Name: N/A  . Number of Children: N/A  . Years of Education: N/A   Occupational History  . Not on file.   Social History Main Topics  . Smoking status: Never Smoker   . Smokeless tobacco: Never Used  . Alcohol Use: Yes     Comment: occasionally  . Drug Use: No  . Sexual Activity:  Partners: Male    Copy: None   Other Topics Concern  . Not on file   Social History Narrative    REVIEW OF SYSTEMS: Constitutional: No fevers, chills, or sweats, no generalized fatigue, change in appetite Eyes: No visual changes, double vision, eye pain Ear, nose and throat: No hearing loss, ear pain, nasal congestion, sore throat Cardiovascular: No chest pain, palpitations Respiratory:  No shortness of breath  at rest or with exertion, wheezes GastrointestinaI: No nausea, vomiting, diarrhea, abdominal pain, fecal incontinence Genitourinary:  No dysuria, urinary retention or frequency Musculoskeletal:  No neck pain, back pain Integumentary: No rash, pruritus, skin lesions Neurological: as above Psychiatric: No depression, insomnia, anxiety Endocrine: No palpitations, fatigue, diaphoresis, mood swings, change in appetite, change in weight, increased thirst Hematologic/Lymphatic:  No anemia, purpura, petechiae. Allergic/Immunologic: no itchy/runny eyes, nasal congestion, recent allergic reactions, rashes  PHYSICAL EXAM: Filed Vitals:   02/25/15 1327  BP: 116/70  Pulse: 70   General: No acute distress.  Patient appears well-groomed.  normal body habitus. Head:  Normocephalic/atraumatic Eyes:  Fundoscopic exam unremarkable without vessel changes, exudates, hemorrhages or papilledema. Neck: supple, no paraspinal tenderness, full range of motion Heart:  Regular rate and rhythm Lungs:  Clear to auscultation bilaterally Back: No paraspinal tenderness Neurological Exam: alert and oriented to person, place, and time. Attention span and concentration intact, recent and remote memory intact, fund of knowledge intact.  Speech fluent and not dysarthric, language intact.  CN II-XII intact. Fundoscopic exam unremarkable without vessel changes, exudates, hemorrhages or papilledema.  Bulk and tone normal, muscle strength 5/5 throughout.  Sensation to light touch, temperature and vibration intact.  Deep tendon reflexes 2+ throughout.  Finger to nose and heel to shin testing intact.  Gait normal  IMPRESSION: Migraine without aura  PLAN: 1.  Will try atenolol  daily and she will call in 4 weeks with update 2.  Cefaly daily and as needed 3.  maxalt as needed 4.  Follow up in 6 months.  15 minutes spent face to face with patient, over 50% spent discussing management.  Shon Millet, DO  CC:  Lilyan Punt,  MD

## 2015-03-24 ENCOUNTER — Other Ambulatory Visit: Payer: Self-pay | Admitting: Neurology

## 2015-08-26 ENCOUNTER — Encounter: Payer: Self-pay | Admitting: Neurology

## 2015-08-26 ENCOUNTER — Ambulatory Visit (INDEPENDENT_AMBULATORY_CARE_PROVIDER_SITE_OTHER): Payer: BLUE CROSS/BLUE SHIELD | Admitting: Neurology

## 2015-08-26 VITALS — BP 110/70 | HR 70 | Ht 64.0 in | Wt 145.0 lb

## 2015-08-26 DIAGNOSIS — G43009 Migraine without aura, not intractable, without status migrainosus: Secondary | ICD-10-CM | POA: Diagnosis not present

## 2015-08-26 NOTE — Progress Notes (Signed)
NEUROLOGY FOLLOW UP OFFICE NOTE  [name] [id number]  HISTORY OF PRESENT ILLNESS: Rueben BashVictoria Severe is a 30 year old right-handed woman who follows up for chronic migraine without aura.     UPDATE: Atenolol made her lightheaded, so she stopped it.  She then started taking magnesium 400mg  daily, which seems to be helping.  She also uses the Cefaly band most days.  She has about 6 days of headache a month, 50% are severe, for which she takes Maxalt.  Otherwise, she uses the Cefaly as needed as well.  She has tizanidine as needed for neck pain.   Stress/depression:  No Sleep hygiene:  Good except when neck bothers her.   HISTORY: Onset:  On and off since 30 years old Location:  Temples (unilateral either side or bilateral) Quality:  sharp Initial intensity:  10/10; August 5/10 Aura:  no Associated symptoms:  Nausea (if severe), photophobia, phonophobia, osmophobia   Initial duration:  Constant without medication (2-3 hours with medication); August 1 hour Initial frequency:  daily; August 8 headaches per month (2-3 headaches severe).   Triggers/exacerbating factors:  none Relieving factors:  Ice pack, elastic head band Activity:  Force self to perform ADLs   Past abortive therapy:  Excedrin (helps), NSAIDs (sometimes helps); was taking almost daily. Past preventative therapy:  Verapamil 120mg  (ineffective, made lightheaded), topamax (initially helpful, weight loss, depression), bupropion (sleepy, ineffective), nortriptyline (mood swings), venlafaxine ER 187.5mg  (ineffective), Toprol XL (side effects), Depakote ER 750mg , atenolol (lightheadedness)  PAST MEDICAL HISTORY: Past Medical History:  Diagnosis Date  . Headache(784.0)   . Hypertriglyceridemia   . No pertinent past medical history     MEDICATIONS: Current Outpatient Prescriptions on File Prior to Visit  Medication Sig Dispense Refill  . acetaminophen (TYLENOL) 325 MG tablet Take 650 mg by mouth every 6 (six) hours as  needed. For pain.     . rizatriptan (MAXALT-MLT) 10 MG disintegrating tablet DISSOLVE 1 TABLET BY MOUTH AS NEEDED FOR MIGRAINE; MAY REPEAT IN 2 HOURS AS NEEDED 10 tablet 5  . tiZANidine (ZANAFLEX) 4 MG tablet TAKE 1 TABLET BY MOUTH EVERY 8 HOURS AS NEEDED 30 tablet 0   No current facility-administered medications on file prior to visit.     ALLERGIES: No Known Allergies  FAMILY HISTORY: Family History  Problem Relation Age of Onset  . Anesthesia problems Neg Hx   . Hypotension Neg Hx   . Malignant hyperthermia Neg Hx   . Pseudochol deficiency Neg Hx   . Diabetes Father   . Stroke Father     SOCIAL HISTORY: Social History   Social History  . Marital status: Married    Spouse name: N/A  . Number of children: N/A  . Years of education: N/A   Occupational History  . Not on file.   Social History Main Topics  . Smoking status: Never Smoker  . Smokeless tobacco: Never Used  . Alcohol use Yes     Comment: occasionally  . Drug use: No  . Sexual activity: Yes    Partners: Male    Birth control/ protection: None   Other Topics Concern  . Not on file   Social History Narrative  . No narrative on file    REVIEW OF SYSTEMS: Constitutional: No fevers, chills, or sweats, no generalized fatigue, change in appetite Eyes: No visual changes, double vision, eye pain Ear, nose and throat: No hearing loss, ear pain, nasal congestion, sore throat Cardiovascular: No chest pain, palpitations Respiratory:  No  shortness of breath at rest or with exertion, wheezes GastrointestinaI: No nausea, vomiting, diarrhea, abdominal pain, fecal incontinence Genitourinary:  No dysuria, urinary retention or frequency Musculoskeletal:  No neck pain, back pain Integumentary: No rash, pruritus, skin lesions Neurological: as above Psychiatric: No depression, insomnia, anxiety Endocrine: No palpitations, fatigue, diaphoresis, mood swings, change in appetite, change in weight, increased  thirst Hematologic/Lymphatic:  No purpura, petechiae. Allergic/Immunologic: no itchy/runny eyes, nasal congestion, recent allergic reactions, rashes  PHYSICAL EXAM: Vitals:   08/26/15 1440  BP: 110/70  Pulse: 70   General: No acute distress.  Patient appears well-groomed.  normal body habitus. Head:  Normocephalic/atraumatic Eyes:  Fundi examined but not visualized Neck: supple, no paraspinal tenderness, full range of motion Heart:  Regular rate and rhythm Lungs:  Clear to auscultation bilaterally Back: No paraspinal tenderness Neurological Exam: alert and oriented to person, place, and time. Attention span and concentration intact, recent and remote memory intact, fund of knowledge intact.  Speech fluent and not dysarthric, language intact.  CN II-XII intact. Bulk and tone normal, muscle strength 5/5 throughout.  Sensation to light touch, temperature and vibration intact.  Deep tendon reflexes 2+ throughout, toes downgoing.  Finger to nose and heel to shin testing intact.  Gait normal, Romberg negative.  IMPRESSION: Migraine without aura  PLAN: 1.  She would like to continue with supplements.  In addition to magnesium, will have her also try riboflavin, coenzyme Q10 and turmeric. 2.  Continue Cefaly band 3.  Maxalt as needed 4.  Follow up in 6 months or as needed.  15 minutes spent face to face with patient, over 50% spent counseling.  Shon MilletAdam Jaffe, DO  CC:  Lilyan PuntScott Luking, MD

## 2015-08-26 NOTE — Patient Instructions (Addendum)
1.  Use supplements:  Magnesium 400mg  to 600mg  daily, riboflavin 400mg , Coenzyme Q 10 100mg  three times daily, turmeric 500mg  twice daily. 2.  Continue Cefaly band 3.  Use Maxalt as needed 4.  Follow up in 6 months or as needed.

## 2015-09-25 ENCOUNTER — Other Ambulatory Visit: Payer: Self-pay | Admitting: Obstetrics and Gynecology

## 2015-09-25 DIAGNOSIS — N63 Unspecified lump in unspecified breast: Secondary | ICD-10-CM

## 2015-09-30 ENCOUNTER — Other Ambulatory Visit: Payer: BLUE CROSS/BLUE SHIELD

## 2015-10-07 ENCOUNTER — Other Ambulatory Visit: Payer: Self-pay | Admitting: Neurology

## 2015-10-27 ENCOUNTER — Other Ambulatory Visit: Payer: Self-pay | Admitting: Neurology

## 2015-11-05 ENCOUNTER — Ambulatory Visit (INDEPENDENT_AMBULATORY_CARE_PROVIDER_SITE_OTHER): Payer: BLUE CROSS/BLUE SHIELD | Admitting: *Deleted

## 2015-11-05 DIAGNOSIS — Z23 Encounter for immunization: Secondary | ICD-10-CM | POA: Diagnosis not present

## 2016-02-26 ENCOUNTER — Telehealth: Payer: Self-pay | Admitting: Neurology

## 2016-02-26 NOTE — Telephone Encounter (Signed)
PT called and has an appointment tomorrow with Dr Everlena CooperJaffe but wants to know if she really needs to come in to see him and she wants to get her Topamax refilled/Dawn CB# [phone]

## 2016-02-27 ENCOUNTER — Ambulatory Visit (INDEPENDENT_AMBULATORY_CARE_PROVIDER_SITE_OTHER): Payer: BLUE CROSS/BLUE SHIELD | Admitting: Neurology

## 2016-02-27 ENCOUNTER — Encounter: Payer: Self-pay | Admitting: Neurology

## 2016-02-27 VITALS — BP 112/74 | HR 86 | Ht 64.0 in | Wt 145.6 lb

## 2016-02-27 DIAGNOSIS — G43009 Migraine without aura, not intractable, without status migrainosus: Secondary | ICD-10-CM | POA: Diagnosis not present

## 2016-02-27 MED ORDER — TOPIRAMATE 25 MG PO TABS: 25.0000 mg | ORAL_TABLET | Freq: Every day | ORAL | 5 refills | Status: DC

## 2016-02-27 NOTE — Telephone Encounter (Signed)
Patient saw Dr. Everlena CooperJaffe today as scheduled.

## 2016-02-27 NOTE — Progress Notes (Signed)
NEUROLOGY FOLLOW UP OFFICE NOTE  Kara Schwartz [id number]  HISTORY OF PRESENT ILLNESS: Kara Schwartz is a 31 year old right-handed woman who follows up for chronic migraine without aura.     UPDATE: Migraines have increased over the past 2 to 3 months, possibly due to changes in weather. Intensity:  5-10/10 Duration:  30 minutes with Maxalt.   Frequency:  4 to 5 days out of the week Frequency of abortive medication: Maxalt no more than 2 days a week Current NSAIDS:  no Current analgesics:  no Current triptans:  Maxalt Current anti-emetic:  no Current muscle relaxants:  tizanidine (for neck pain) Current anti-anxiolytic:  no Current sleep aide:  no Current Antihypertensive medications:  no Current Antidepressant medications:  no Current Anticonvulsant medications:  no Current Vitamins/Herbal/Supplements:  magnesium, riboflavin, coenzyme Q10, turmeric, apple cider vinegary (all ineffective) Current Antihistamines/Decongestants:  no Other therapy:  Cefaly   Stress/depression:  No Sleep hygiene:  Good except when neck bothers her.   HISTORY: Onset:  On and off since 31 years old Location:  [place] (unilateral either side or bilateral) Quality:  sharp Initial intensity:  10/10; August 5/10 Aura:  no Associated symptoms:  Nausea (if severe), photophobia, phonophobia, osmophobia   Initial duration:  Constant without medication (2-3 hours with medication); August 1 hour Initial frequency:  daily; August 6 headaches per month Triggers/exacerbating factors:  none Relieving factors:  Ice pack, elastic head band Activity:  Force self to perform ADLs   Past abortive therapy:  Excedrin (helps), NSAIDs (sometimes helps); was taking almost daily. Past preventative therapy:  Verapamil 120mg  (ineffective, made lightheaded), topamax (initially helpful, weight loss, depression), bupropion (sleepy, ineffective), nortriptyline (mood swings), venlafaxine ER 187.5mg  (ineffective), Toprol  XL (side effects), Depakote ER 750mg , atenolol (lightheadedness)  PAST MEDICAL HISTORY: Past Medical History:  Diagnosis Date  . Headache(784.0)   . Hypertriglyceridemia   . No pertinent past medical history     MEDICATIONS: Current Outpatient Prescriptions on File Prior to Visit  Medication Sig Dispense Refill  . acetaminophen (TYLENOL) 325 MG tablet Take 650 mg by mouth every 6 (six) hours as needed. For pain.     . rizatriptan (MAXALT-MLT) 10 MG disintegrating tablet DISSOLVE 1 TABLET BY MOUTH AS NEEDED FOR MIGRAINE; MAY REPEAT IN 2 HOURS AS NEEDED 10 tablet 4  . tiZANidine (ZANAFLEX) 4 MG tablet TAKE 1 TABLET BY MOUTH EVERY 8 HOURS AS NEEDED 30 tablet 0   No current facility-administered medications on file prior to visit.     ALLERGIES: No Known Allergies  FAMILY HISTORY: Family History  Problem Relation Age of Onset  . Diabetes Father   . Stroke Father   . Anesthesia problems Neg Hx   . Hypotension Neg Hx   . Malignant hyperthermia Neg Hx   . Pseudochol deficiency Neg Hx     SOCIAL HISTORY: Social History   Social History  . Marital status: Married    Spouse name: N/A  . Number of children: N/A  . Years of education: N/A   Occupational History  . Not on file.   Social History Main Topics  . Smoking status: Never Smoker  . Smokeless tobacco: Never Used  . Alcohol use Yes     Comment: occasionally  . Drug use: No  . Sexual activity: Yes    Partners: Male    Birth control/ protection: None   Other Topics Concern  . Not on file   Social History Narrative  . No narrative on file  REVIEW OF SYSTEMS: Constitutional: No fevers, chills, or sweats, no generalized fatigue, change in appetite Eyes: No visual changes, double vision, eye pain Ear, nose and throat: No hearing loss, ear pain, nasal congestion, sore throat Cardiovascular: No chest pain, palpitations Respiratory:  No shortness of breath at rest or with exertion, wheezes GastrointestinaI: No  nausea, vomiting, diarrhea, abdominal pain, fecal incontinence Genitourinary:  No dysuria, urinary retention or frequency Musculoskeletal:  No neck pain, back pain Integumentary: No rash, pruritus, skin lesions Neurological: as above Psychiatric: No depression, insomnia, anxiety Endocrine: No palpitations, fatigue, diaphoresis, mood swings, change in appetite, change in weight, increased thirst Hematologic/Lymphatic:  No purpura, petechiae. Allergic/Immunologic: no itchy/runny eyes, nasal congestion, recent allergic reactions, rashes  PHYSICAL EXAM: Vitals:   02/27/16 0908  BP: 112/74  Pulse: 86   General: No acute distress.  Patient appears well-groomed.  normal body habitus. Head:  Normocephalic/atraumatic Eyes:  Fundi examined but not visualized Neck: supple, no paraspinal tenderness, full range of motion Heart:  Regular rate and rhythm Lungs:  Clear to auscultation bilaterally Back: No paraspinal tenderness Neurological Exam: alert and oriented to person, place, and time. Attention span and concentration intact, recent and remote memory intact, fund of knowledge intact.  Speech fluent and not dysarthric, language intact.  CN II-XII intact. Bulk and tone normal, muscle strength 5/5 throughout.  Sensation to light touch  intact.  Deep tendon reflexes 2+ throughout.  Finger to nose testing intact.  Gait normal  IMPRESSION: Worsening migraine  PLAN: 1.  Will restart a preventative.  She would like to try topiramate again.  She had side effects last time, but tolerated it in the past and maybe she may tolerate it this time.  Start 25mg  at bedtime.  We can increase dose in 4 weeks if needed.  Discussed that she should not get pregnant while on topiramate. 2.  Maxalt and Cefaly as needed.  Also, Cefaly daily 3.  Follow up in 6 months or sooner if needed.  Shon MilletAdam Jaffe, DO  CC:  Lilyan PuntScott Luking, MD

## 2016-02-27 NOTE — Patient Instructions (Signed)
Migraine Recommendations: 1.  We will start topiramate (Topamax) 25mg  at bedtime  Possible side effects include: impaired thinking, sedation, paresthesias (numbness and tingling) and weight loss.  It may cause dehydration and there is a small risk for kidney stones, so make sure to stay hydrated with water during the day.  There is also a very small risk for glaucoma, so if you notice any change in your vision while taking this medication, see an ophthalmologist.  As topiramate may affect fetus, do not get pregnant while taking topiramate.  2.  Use Maxalt or Cefaly as needed.  Use Cefaly daily as well 3.  Limit use of pain relievers to no more than 2 days out of the week.  These medications include acetaminophen, ibuprofen, triptans and narcotics.  This will help reduce risk of rebound headaches. 4.  Be aware of common food triggers such as processed sweets, processed foods with nitrites (such as deli meat, hot dogs, sausages), foods with MSG, alcohol (such as wine), chocolate, certain cheeses, certain fruits (dried fruits, some citrus fruit), vinegar, diet soda. 4.  Avoid caffeine 5.  Routine exercise 6.  Proper sleep hygiene 7.  Stay adequately hydrated with water 8.  Keep a headache diary. 9.  Maintain proper stress management. 10.  Do not skip meals. 11.  Follow up in 6 months but CONTACT ME IN 4 WEEKS IF WE NEED TO INCREASE TOPAMAX OR SOONER IF HAVING SIDE EFFECTS

## 2016-03-30 ENCOUNTER — Other Ambulatory Visit: Payer: Self-pay | Admitting: Neurology

## 2016-04-28 ENCOUNTER — Telehealth: Payer: Self-pay | Admitting: Neurology

## 2016-04-28 ENCOUNTER — Other Ambulatory Visit: Payer: Self-pay | Admitting: Neurology

## 2016-04-28 MED ORDER — TOPIRAMATE 25 MG PO TABS: 50.0000 mg | ORAL_TABLET | Freq: Every day | ORAL | 3 refills | Status: DC

## 2016-04-28 NOTE — Telephone Encounter (Signed)
Will forward message to provider. 

## 2016-04-28 NOTE — Telephone Encounter (Signed)
Refilled topiramate  at bedtime.

## 2016-04-28 NOTE — Telephone Encounter (Signed)
Patient just wanted to let us know she was doing good on the topamax she is taking 2 pills a day and will need a refill soon she will let us know when she does

## 2016-06-02 ENCOUNTER — Other Ambulatory Visit: Payer: Self-pay | Admitting: Neurology

## 2016-06-22 ENCOUNTER — Other Ambulatory Visit: Payer: Self-pay | Admitting: Neurology

## 2016-07-08 ENCOUNTER — Other Ambulatory Visit: Payer: Self-pay | Admitting: Neurology

## 2016-08-17 ENCOUNTER — Other Ambulatory Visit: Payer: Self-pay | Admitting: Neurology

## 2016-08-17 ENCOUNTER — Ambulatory Visit: Payer: BLUE CROSS/BLUE SHIELD | Admitting: Neurology

## 2016-08-26 ENCOUNTER — Ambulatory Visit: Payer: BLUE CROSS/BLUE SHIELD | Admitting: Neurology

## 2016-09-03 ENCOUNTER — Other Ambulatory Visit: Payer: Self-pay | Admitting: Neurology

## 2016-09-04 ENCOUNTER — Other Ambulatory Visit: Payer: Self-pay | Admitting: Neurology

## 2016-09-23 ENCOUNTER — Other Ambulatory Visit: Payer: Self-pay | Admitting: Neurology

## 2016-09-28 ENCOUNTER — Other Ambulatory Visit: Payer: Self-pay | Admitting: Obstetrics and Gynecology

## 2016-09-28 DIAGNOSIS — N644 Mastodynia: Secondary | ICD-10-CM

## 2016-09-28 DIAGNOSIS — N6489 Other specified disorders of breast: Secondary | ICD-10-CM

## 2016-09-30 ENCOUNTER — Other Ambulatory Visit: Payer: Self-pay | Admitting: Neurology

## 2016-10-15 ENCOUNTER — Other Ambulatory Visit: Payer: BLUE CROSS/BLUE SHIELD

## 2016-10-22 ENCOUNTER — Ambulatory Visit
Admission: RE | Admit: 2016-10-22 | Discharge: 2016-10-22 | Disposition: A | Discharge status: Home / Self Care | Payer: Managed Care, Other (non HMO) | Source: Ambulatory Visit | Attending: Obstetrics and Gynecology | Admitting: Obstetrics and Gynecology

## 2016-10-22 ENCOUNTER — Other Ambulatory Visit: Payer: Self-pay | Admitting: Obstetrics and Gynecology

## 2016-10-22 ENCOUNTER — Other Ambulatory Visit: Payer: Self-pay

## 2016-10-22 DIAGNOSIS — N6489 Other specified disorders of breast: Secondary | ICD-10-CM

## 2016-10-22 DIAGNOSIS — N644 Mastodynia: Secondary | ICD-10-CM

## 2016-10-22 DIAGNOSIS — N63 Unspecified lump in unspecified breast: Secondary | ICD-10-CM

## 2016-10-28 ENCOUNTER — Other Ambulatory Visit: Payer: Self-pay | Admitting: Neurology

## 2016-11-04 ENCOUNTER — Encounter: Payer: Self-pay | Admitting: Family Medicine

## 2016-11-04 ENCOUNTER — Ambulatory Visit (INDEPENDENT_AMBULATORY_CARE_PROVIDER_SITE_OTHER): Payer: Managed Care, Other (non HMO) | Admitting: Family Medicine

## 2016-11-04 VITALS — BP 126/84 | Temp 98.7°F | Ht 65.0 in | Wt 130.0 lb

## 2016-11-04 DIAGNOSIS — M542 Cervicalgia: Secondary | ICD-10-CM

## 2016-11-04 MED ORDER — TOPIRAMATE 25 MG PO TABS: 50.0000 mg | ORAL_TABLET | Freq: Every day | ORAL | 5 refills | Status: DC

## 2016-11-04 MED ORDER — TIZANIDINE HCL 4 MG PO TABS: 4.0000 mg | ORAL_TABLET | Freq: Three times a day (TID) | ORAL | 5 refills | Status: DC | PRN

## 2016-11-04 MED ORDER — RIZATRIPTAN BENZOATE 10 MG PO TBDP: ORAL_TABLET | ORAL | 5 refills | Status: DC

## 2016-11-04 MED ORDER — DICLOFENAC SODIUM 75 MG PO TBEC: 75.0000 mg | DELAYED_RELEASE_TABLET | Freq: Two times a day (BID) | ORAL | 1 refills | Status: DC

## 2016-11-04 NOTE — Progress Notes (Signed)
   Subjective:    Patient ID: Kara Schwartz, female    DOB: 08/25/1985, 31 y.o.   MRN: [id number]  HPI  Patient here today for neck pain that raidiates down spine. She says the pain is worse while sitting, as she stands the pain subsides. She noticed this around two weeks ago. She has been taking a half of a Zanaflex 4 mg at night to help with sleep she is able to get through the night after taking this until it wears off. She is taking Ibuprofen prn.   Has not had any other particular troubles no known injuries.  Review of Systems Relates neck pain denies headache denies nausea vomiting diarrhea blurred vision vomiting denies numbness tingling into the arms    Objective:   Physical Exam Reflex is good good range of motion some mild tenderness with flexing forward and rotating lungs clear heart regular strength good Exercises printed and given      Assessment & Plan:  Cervical neck pain Exercises show Anti-inflammatories as needed Muscle relaxants at nighttime If not significant improvement over the next 2 weeks referral to orthopedic neck specialist We will off on any x-rays currently May need x-rays if persists No sign of nerve impingement

## 2016-11-16 ENCOUNTER — Telehealth: Payer: Self-pay | Admitting: Family Medicine

## 2016-11-16 ENCOUNTER — Encounter: Payer: Self-pay | Admitting: Family Medicine

## 2016-11-16 DIAGNOSIS — M542 Cervicalgia: Secondary | ICD-10-CM

## 2016-11-16 NOTE — Telephone Encounter (Signed)
Spoke with patient and informed her per Dr.Scott Luking- we have ordered the xray. Patient verbalized understanding.

## 2016-11-16 NOTE — Telephone Encounter (Signed)
Patient wanted to let Dr. Lorin PicketScott know that she would like to go ahead and be set up for x-rays.  She seen Dr. Lorin PicketScott on 11/04/16 for cervical pain.

## 2016-11-16 NOTE — Telephone Encounter (Signed)
Please send an order for cervical spine x-rays await the results

## 2016-11-18 ENCOUNTER — Ambulatory Visit (HOSPITAL_COMMUNITY)
Admission: RE | Admit: 2016-11-18 | Discharge: 2016-11-18 | Disposition: A | Discharge status: Home / Self Care | Payer: Managed Care, Other (non HMO) | Source: Ambulatory Visit | Attending: Family Medicine | Admitting: Family Medicine

## 2016-11-18 DIAGNOSIS — M542 Cervicalgia: Secondary | ICD-10-CM | POA: Insufficient documentation

## 2016-11-18 DIAGNOSIS — M4802 Spinal stenosis, cervical region: Secondary | ICD-10-CM | POA: Insufficient documentation

## 2016-11-19 NOTE — Telephone Encounter (Signed)
Based on the symptomatology I would recommend a follow-up visit to check reflexes and strength in both arms.  We could see her next week.  At that visit I will discuss with her the options of doing a MRI versus seeing a orthopedic/neurosurgical spine specialist-but often it can take several weeks to get in with the specialist and typically they prefer to have the MRI completed before being seen.  A follow-up office visit allows Kara Schwartz for further documentation of any numbness or weakness which can help get the MRI approved through her insurance

## 2016-11-22 ENCOUNTER — Encounter: Payer: Self-pay | Admitting: Family Medicine

## 2016-11-22 ENCOUNTER — Ambulatory Visit: Payer: Managed Care, Other (non HMO) | Admitting: Family Medicine

## 2016-11-22 VITALS — BP 130/90 | Ht 65.0 in | Wt 134.0 lb

## 2016-11-22 DIAGNOSIS — M542 Cervicalgia: Secondary | ICD-10-CM

## 2016-11-22 DIAGNOSIS — G542 Cervical root disorders, not elsewhere classified: Secondary | ICD-10-CM | POA: Diagnosis not present

## 2016-11-22 NOTE — Progress Notes (Signed)
   Subjective:    Patient ID: Kara Schwartz, female    DOB: 06/01/1985, 31 y.o.   MRN: [id number]  Neck Pain   This is a new problem. The current episode started more than 1 month ago. Associated symptoms include numbness.  Patient is had neck discomfort going on now for over a month She is tried anti-inflammatory stretching exercises massage and is not gotten any improvement she is been experiencing over the past couple weeks pain and discomfort radiating into more the left arm than the right arm at times her left arm feels weak other times it does not she is also had significant numbness and tingling into the arm and she is woken up a few times with her full arm being numb and discomfort. Patient in today for a follow up on Cervical pain and numbness in arms.  She denies previous trouble she did have x-rays which showed some possible degenerative disc disease with possible impingement  Review of Systems  Musculoskeletal: Positive for neck pain.  Neurological: Positive for numbness.  No difficulty swallowing breathing no chest pain     Objective:   Physical Exam  Lungs clear respiratory rate normal heart is regular no murmurs pulses normal upper back mild tenderness spine area mild tenderness on the left upper neck  Reflexes in both arms good strength good    Assessment & Plan:  Cervical impingement Failed conservative treatment over the past 4 weeks with anti-inflammatories massage gentle range of motion Try TENS unit-prescription written  Continue anti-inflammatory Muscle relaxer in the evening time when necessary MRI ordered await the results Indication for MRI of cervical nerve impingement with significant pain discomfort failed conservative management Also will need to get in with specialist but we would like to get an MRI first

## 2016-11-24 ENCOUNTER — Encounter: Payer: Self-pay | Admitting: Family Medicine

## 2016-11-28 ENCOUNTER — Other Ambulatory Visit: Payer: Self-pay | Admitting: Family Medicine

## 2016-11-29 ENCOUNTER — Telehealth: Payer: Self-pay | Admitting: Family Medicine

## 2016-11-29 DIAGNOSIS — M542 Cervicalgia: Secondary | ICD-10-CM

## 2016-11-29 NOTE — Telephone Encounter (Signed)
Pt's insurance DENIED the MRI C-spine (questions were answered & OV note was faxed in for review)  Please advise

## 2016-11-30 ENCOUNTER — Ambulatory Visit (HOSPITAL_COMMUNITY): Payer: Managed Care, Other (non HMO)

## 2016-11-30 NOTE — Telephone Encounter (Signed)
Referral Dr. Shon [facility]atonBrooks [facility]

## 2016-11-30 NOTE — Telephone Encounter (Signed)
Pt states she's been taking it really easy & this seems to have helped  But when she tries her regular activities the pain returns  Pt agrees and would like referral to Dr. Shon BatonBrooks  Please initiate referral in system so that I may process

## 2016-11-30 NOTE — Telephone Encounter (Signed)
Left message on pt's voicemail  MRI was denied, Dr. Lorin PicketScott would like to know how she's doing and that he will probably refer her to Dr. Shon BatonBrooks @ [facility]

## 2016-11-30 NOTE — Telephone Encounter (Signed)
Urgent Referral put in

## 2016-11-30 NOTE — Telephone Encounter (Signed)
More than likely it was denied because he has not had the symptoms long enough for the insurance company to jump on board.  I would recommend finding out from the patient how she is doing.  And I would also recommend the next step that we would typically take in a situation like this is to get an appointment with a spine specialist they can typically get MRIs ordered through their specialty without having denial issues with insurance companies.  I would recommend either the spine specialist Dr. Shon [facility]Brooks with [facility] or at [facility] (her mom has had similar neck problems it would not surprise me if she wants to be seen by similar specialist that her mom has been seen by)  please also let me know how she is doing in regards to numbness pain discomfort and weakness-thank you

## 2017-01-04 ENCOUNTER — Other Ambulatory Visit: Payer: Self-pay | Admitting: Family Medicine

## 2017-01-06 ENCOUNTER — Telehealth: Payer: Self-pay | Admitting: Family Medicine

## 2017-01-06 MED ORDER — SULFACETAMIDE SODIUM 10 % OP SOLN: OPHTHALMIC | 0 refills | Status: DC

## 2017-01-06 NOTE — Telephone Encounter (Signed)
Per Dr. Lorin PicketScott call in eye drops per protocol. Sent in to the pharmacy pt is aware.

## 2017-01-06 NOTE — Telephone Encounter (Signed)
Patient believes she has pink eye.  She said she started showing redness in her eyes this past Sunday.  The redness increased but seems to have cleared up for the most part today.  She is having mucous in both of her eyes still.  She would like drops called in.  [facility] [place]

## 2017-01-14 ENCOUNTER — Telehealth (HOSPITAL_COMMUNITY): Payer: Self-pay | Admitting: Family Medicine

## 2017-01-14 NOTE — Telephone Encounter (Signed)
01/14/17  Pt had left a previous message asking us to call her because she had a referral and needed to schedule.  I called but no answer and left her a message

## 2017-01-17 ENCOUNTER — Telehealth: Payer: Self-pay | Admitting: Family Medicine

## 2017-01-17 MED ORDER — FLUCONAZOLE 150 MG PO TABS: ORAL_TABLET | ORAL | 0 refills | Status: DC

## 2017-01-17 NOTE — Telephone Encounter (Signed)
Per protocol:   fluconazole (DIFLUCAN) 150 MG tablet  0 ordered         Summary: Take one tablet three days apart    Prescription sent electronically to pharmacy. Patient notified.

## 2017-01-17 NOTE — Telephone Encounter (Signed)
Requesting Rx for yeast infection.  [facility]

## 2017-01-18 ENCOUNTER — Other Ambulatory Visit: Payer: Self-pay | Admitting: Family Medicine

## 2017-01-20 ENCOUNTER — Ambulatory Visit (HOSPITAL_COMMUNITY): Payer: Managed Care, Other (non HMO) | Attending: Orthopedic Surgery

## 2017-01-20 ENCOUNTER — Other Ambulatory Visit: Payer: Self-pay

## 2017-01-20 ENCOUNTER — Encounter (HOSPITAL_COMMUNITY): Payer: Self-pay

## 2017-01-20 DIAGNOSIS — M436 Torticollis: Secondary | ICD-10-CM

## 2017-01-20 DIAGNOSIS — M542 Cervicalgia: Secondary | ICD-10-CM

## 2017-01-20 DIAGNOSIS — M6281 Muscle weakness (generalized): Secondary | ICD-10-CM

## 2017-01-20 NOTE — Therapy (Signed)
[facility] [address] [place], [place], [address] Phone: [phone]   Fax:  [phone]  Physical Therapy Evaluation  Patient Details  Name: Kara Schwartz MRN: [id number] Date of Birth: 1985/09/10 Referring Provider: Venita Lick, MD   Encounter Date: 01/20/2017  PT End of Session - 01/20/17 1422    Visit Number  1    Number of Visits  9    Date for PT Re-Evaluation  02/17/17    Authorization Type  Cigna/Cigna Managed    Authorization Time Period  01/20/17 - 02/17/17    PT Start Time  0950    PT Stop Time  1030    PT Time Calculation (min)  40 min    Activity Tolerance  Patient tolerated treatment well;No increased pain    Behavior During Therapy  WFL for tasks assessed/performed       Past Medical History:  Diagnosis Date  . Headache(784.0)   . Hypertriglyceridemia   . No pertinent past medical history     Past Surgical History:  Procedure Laterality Date  . AUGMENTATION MAMMAPLASTY Bilateral    implants  . BREAST ENHANCEMENT SURGERY  2008    There were no vitals filed for this visit.   Subjective Assessment - 01/20/17 0954    Subjective  Patient reports onset of cervical spine pain beginning in the fall after lifting something heavier than normal. She states the pain was delayed and in the past has resolved over time, however it has lingered and remained constant since this incident. She reports it has been keeping her up the last few nights and she has trouble getting comfortable to sleep on her side. She reports he doctor had her on a prescription NSAID however had instructed her to stop. She states after stopping that is when her pain got higher at night and she has started the medication again. She reports she has pain with activities like putting on her make-up, doing her hair, or lifting things. She reports her pain feels like a tightness or "strain".     Limitations  Sitting;Reading;House hold activities;Lifting     How long can you sit comfortably?  immediate     How long can you stand comfortably?  unlimited    How long can you walk comfortably?  unlimited    Diagnostic tests  X-Ray, patient reports MD told her she has slightly decreased curve "similar to whiplash"    Patient Stated Goals  be able to pick up my son without pain    Currently in Pain?  Yes    Pain Score  5     Pain Location  Neck nape of neck    Pain Orientation  Lower    Pain Descriptors / Indicators  Tightness "annoying, can't relax"    Pain Type  Chronic pain    Pain Radiating Towards  headache, 1 episode of numbness and tingling after sleeping but never again    Pain Onset  More than a month ago    Pain Frequency  Constant    Aggravating Factors   sitting, looking down, lifting    Pain Relieving Factors  TENS unit    Effect of Pain on Daily Activities  moderate       [facility] PT Assessment - 01/20/17 0001      Assessment   Medical Diagnosis  [place] of neck Muscles    Referring Provider  Venita Lick, MD    Onset Date/Surgical Date  10/20/16 Approximate: sometime  in fall    Hand Dominance  Right    Next MD Visit  none as of now    Prior Therapy  none      Precautions   Precautions  None      Restrictions   Weight Bearing Restrictions  No      Balance Screen   Has the patient fallen in the past 6 months  No    Has the patient had a decrease in activity level because of a fear of falling?   Yes    Is the patient reluctant to leave their home because of a fear of falling?   No      Home Environment   Living Environment  Private residence    Living Arrangements  Spouse/significant other;Children    Available Help at Discharge  Family      Prior Function   Level of Independence  Independent;Independent with basic ADLs    Vocation  Self employed    Programmer, multimedia    Leisure  painting, reading      Cognition   Overall Cognitive Status  Within Functional Limits for tasks assessed       Observation/Other Assessments   Other Surveys   Other Surveys    Neck Disability Index   22/50      Sensation   Light Touch  Appears Intact      AROM   Overall AROM Comments  pulling sesatnio/tightness    Cervical Flexion  22    Cervical Extension  35    Cervical - Right Side Bend  38    Cervical - Left Side Bend  43    Cervical - Right Rotation  54    Cervical - Left Rotation  60      Strength   Overall Strength  Within functional limits for tasks performed    Overall Strength Comments  MMT 5/5 for BUE throughout    Strength Assessment Site  Cervical    Cervical Flexion  -- cervical endurance test: 5 seconds      Palpation   Spinal mobility  hypomobility of lower cervical spine and upper thoracic spine C5-T6    Palpation comment  tenderness to paraspinals and notabel banding      Special Tests    Special Tests  Cervical    Cervical Tests  Spurling's;Dictraction      Spurling's   Findings  Negative      Distraction Test   Findngs  Negative    Comment  feels like a stretch       Objective measurements completed on examination: See above findings.     [facility] Adult PT Treatment/Exercise - 01/20/17 0001      Exercises   Exercises  Neck      Neck Exercises: Seated   Neck Retraction  10 reps;5 secs       PT Education - 01/20/17 1421    Education provided  Yes    Education Details  Educated on findings and POC. Initiated HEP with cervical chin tucks.    Person(s) Educated  Patient    Methods  Explanation;Demonstration;Handout    Comprehension  Verbalized understanding;Returned demonstration       PT Short Term Goals - 01/20/17 1430      PT SHORT TERM GOAL #1   Title  Patient will be independent with HEP to progress towards goals and improve independence with exercises.     Time  2    Period  Weeks    Status  New    Target Date  02/03/17      PT SHORT TERM GOAL #2   Title  Patient will improve ROM by 10* in all planes for cervical spine to improve  functional mobility and decrease pain during daily activities.     Time  2    Period  Weeks    Status  New        PT Long Term Goals - 01/20/17 1431      PT LONG TERM GOAL #1   Title  Patient will demosntrate 10 point improvement in NDI to demonstrate improved functinoal performance realted to neck mobility.    Time  4    Period  Weeks    Status  New    Target Date  02/17/17      PT LONG TERM GOAL #2   Title  Patient will be able to sit to put on her make-up and fix her hair with no pain to improve QOL.     Time  4    Period  Weeks    Status  New      PT LONG TERM GOAL #3   Title  Patient will improve cervical spine ROM in planes within functional limits to improve functional mobility with no increase in pain.     Time  4    Period  Weeks    Status  New      PT LONG TERM GOAL #4   Title  Patient will demonstrate proper lifting mechanics and be able to perform 10 repeated floor to waist height lifts with no increase in pain; patient will report being abel to pick her son up with no increase or delayed on set of pain throughout a one week period.    Time  4    Period  Weeks       Plan - 01/20/17 1425    Clinical Impression Statement  Patient presents for initial physical therapy evaluation for cervical spine pain. She presents with decreased cervical ROM, hypomobility of lower cervical spine and upper thoracic spine, myofascial restrictions of paraspinals, and decreased muscular endurance. She will benefit from skilled PT interventions to address her current impairments and achieve goals to improve QOL and function.    Clinical Presentation  Stable    Clinical Presentation due to:  decreased ROM, Hypomobility, myofascial restrictions, NDI, clinical judgement    Clinical Decision Making  Low    Rehab Potential  Good    PT Frequency  2x / week    PT Duration  4 weeks    PT Treatment/Interventions  ADLs/Self Care Home Management;Electrical Stimulation;Traction;Moist  Heat;Cryotherapy;Functional mobility training;Therapeutic activities;Therapeutic exercise;Neuromuscular re-education;Patient/family education;Manual techniques;Passive range of motion;Dry needling    PT Next Visit Plan  Review Eval and goals. Initiate manual PA to lower cervical spine and upper thoracic spine. Begin postural training and progress cervical retractions as appropriate. Patient will benefit from education on lifting techniques as mobility improves.    PT Home Exercise Plan  Eval: seated chin retractions;     Consulted and Agree with Plan of Care  Patient       Patient will benefit from skilled therapeutic intervention in order to improve the following deficits and impairments:  Pain, Decreased mobility, Increased muscle spasms, Postural dysfunction, Improper body mechanics, Increased fascial restricitons, Decreased range of motion, Hypomobility, Impaired flexibility  Visit Diagnosis: Cervicalgia  Stiffness of neck  Muscle weakness (generalized)     Problem List  Patient Active Problem List   Diagnosis Date Noted  . Migraine without aura and without status migrainosus, not intractable 08/14/2014  . Anxiety and depression 08/30/2012  . Migraines 08/02/2012    Valentino Saxon, PT, DPT Physical Therapist with [facility]  01/20/2017 4:41 PM    [facility] [address] [place], [place], [address] Phone: [phone]   Fax:  [phone]  Name: AKYRA BOUCHIE MRN: [id number] Date of Birth: 15-Mar-1985

## 2017-01-20 NOTE — Patient Instructions (Signed)
   Cervical Retraction aka Chin Tuck: 1-2 sets of 10 reps with 5 second holds  While maintaining good posture, sit neutrally and place your pointer finger against your chin while looking straight ahead. Without moving the finger, draw your head directly backwards, keeping your gaze parallel to the floor. Return to neutral, bringing your chin back against your pointer finger. Make sure you are not going past the neutral neck position.

## 2017-01-24 ENCOUNTER — Telehealth (HOSPITAL_COMMUNITY): Payer: Self-pay | Admitting: Physical Therapy

## 2017-01-24 NOTE — Telephone Encounter (Signed)
Call l/m---Ask pt if she could come in at 2:30pm on 02/04/17? 2:30 pm on hold for Cjc to see this patient.NF 01/24/17

## 2017-01-25 ENCOUNTER — Ambulatory Visit (HOSPITAL_COMMUNITY): Payer: Managed Care, Other (non HMO) | Admitting: Physical Therapy

## 2017-01-25 DIAGNOSIS — M6281 Muscle weakness (generalized): Secondary | ICD-10-CM

## 2017-01-25 DIAGNOSIS — M542 Cervicalgia: Secondary | ICD-10-CM

## 2017-01-25 DIAGNOSIS — M436 Torticollis: Secondary | ICD-10-CM

## 2017-01-25 NOTE — Therapy (Signed)
[facility] [address] [place], [place], [address] Phone: [phone]   Fax:  [phone]  Physical Therapy Treatment  Patient Details  Name: Kara Schwartz MRN: [id number] Date of Birth: [birth year] Referring Provider: Venita LickBrooks, Dahari, MD   Encounter Date: 01/25/2017  PT End of Session - 01/25/17 1723    Visit Number  2    Number of Visits  9    Date for PT Re-Evaluation  02/17/17    Authorization Type  Cigna/Cigna Managed    Authorization Time Period  01/20/17 - 02/17/17    PT Start Time  1607    PT Stop Time  1645    PT Time Calculation (min)  38 min    Activity Tolerance  Patient tolerated treatment well;No increased pain    Behavior During Therapy  WFL for tasks assessed/performed       Past Medical History:  Diagnosis Date  . Headache(784.0)   . Hypertriglyceridemia   . No pertinent past medical history     Past Surgical History:  Procedure Laterality Date  . AUGMENTATION MAMMAPLASTY Bilateral    implants  . BREAST ENHANCEMENT SURGERY  2008    There were no vitals filed for this visit.  Subjective Assessment - 01/25/17 1745    Subjective  Pt states she is already feeling much better just from doing the chin tucks.  States she currently has a little pain of 1/10.    Currently in Pain?  Yes    Pain Score  1     Pain Location  Neck    Pain Orientation  Lower    Pain Descriptors / Indicators  Tightness                      [facility] Adult PT Treatment/Exercise - 01/25/17 0001      Neck Exercises: Seated   Other Seated Exercise  cervical excursions 5 reps    Other Seated Exercise  thoracic excursions with UE movements 5 reps      Manual Therapy   Manual Therapy  Soft tissue mobilization;Myofascial release    Manual therapy comments  completed seperately from all other skilled interventions    Soft tissue mobilization  decrease tight mm in scapular and cervical region bilaterally    Myofascial Release   suboccipital release in supine             PT Education - 01/25/17 1723    Education provided  Yes    Education Details  reviewed goals per evaluation and HEP    Person(s) Educated  Patient    Methods  Explanation;Demonstration;Tactile cues;Verbal cues;Handout    Comprehension  Verbalized understanding;Returned demonstration       PT Short Term Goals - 01/20/17 1430      PT SHORT TERM GOAL #1   Title  Patient will be independent with HEP to progress towards goals and improve independence with exercises.     Time  2    Period  Weeks    Status  New    Target Date  02/03/17      PT SHORT TERM GOAL #2   Title  Patient will improve ROM by 10* in all planes for cervical spine to improve functional mobility and decrease pain during daily activities.     Time  2    Period  Weeks    Status  New        PT Long Term Goals - 01/20/17 1431  PT LONG TERM GOAL #1   Title  Patient will demosntrate 10 point improvement in NDI to demonstrate improved functinoal performance realted to neck mobility.    Time  4    Period  Weeks    Status  New    Target Date  02/17/17      PT LONG TERM GOAL #2   Title  Patient will be able to sit to put on her make-up and fix her hair with no pain to improve QOL.     Time  4    Period  Weeks    Status  New      PT LONG TERM GOAL #3   Title  Patient will improve cervical spine ROM in planes within functional limits to improve functional mobility with no increase in pain.     Time  4    Period  Weeks    Status  New      PT LONG TERM GOAL #4   Title  Patient will demonstrate proper lifting mechanics and be able to perform 10 repeated floor to waist height lifts with no increase in pain; patient will report being abel to pick her son up with no increase or delayed on set of pain throughout a one week period.    Time  4    Period  Weeks            Plan - 01/25/17 1725    Clinical Impression Statement  reviewed evaluation including  goals and HEP.  Pt without questions or concerns.  Initiated cervical and thoracic excursions to help improve mobility.  Pt with most difficlty completing rotations and sidebends.    Began manual with one small spasm in Lt scapular region and general tightness in upper traps, easily decreased with manual.  Painfree at EOS.    Rehab Potential  Good    PT Frequency  2x / week    PT Duration  4 weeks    PT Treatment/Interventions  ADLs/Self Care Home Management;Electrical Stimulation;Traction;Moist Heat;Cryotherapy;Functional mobility training;Therapeutic activities;Therapeutic exercise;Neuromuscular re-education;Patient/family education;Manual techniques;Passive range of motion;Dry needling    PT Next Visit Plan  Complete PA to lower cervical spine and upper thoracic spine if with PT.  PRogress postural training and progress cervical retractions as appropriate. Patient will benefit from education on lifting techniques as mobility improves..     PT Home Exercise Plan  Eval: seated chin retractions;     Consulted and Agree with Plan of Care  Patient       Patient will benefit from skilled therapeutic intervention in order to improve the following deficits and impairments:  Pain, Decreased mobility, Increased muscle spasms, Postural dysfunction, Improper body mechanics, Increased fascial restricitons, Decreased range of motion, Hypomobility, Impaired flexibility  Visit Diagnosis: Cervicalgia  Stiffness of neck  Muscle weakness (generalized)     Problem List Patient Active Problem List   Diagnosis Date Noted  . Migraine without aura and without status migrainosus, not intractable 08/14/2014  . Anxiety and depression 08/30/2012  . Migraines 08/02/2012   Kara Schwartz, PTA/CLT [phone]  Kara Schwartz 01/25/2017, 5:46 PM  [facility] [address] [place], [place], [address] Phone: [phone]   Fax:  [phone]  Name: Kara Schwartz MRN: [id number] Date of Birth: 1985-03-27

## 2017-01-27 ENCOUNTER — Other Ambulatory Visit: Payer: Self-pay

## 2017-01-27 ENCOUNTER — Ambulatory Visit (HOSPITAL_COMMUNITY): Payer: Managed Care, Other (non HMO) | Admitting: Physical Therapy

## 2017-01-27 ENCOUNTER — Encounter (HOSPITAL_COMMUNITY): Payer: Self-pay | Admitting: Physical Therapy

## 2017-01-27 DIAGNOSIS — M436 Torticollis: Secondary | ICD-10-CM

## 2017-01-27 DIAGNOSIS — M542 Cervicalgia: Secondary | ICD-10-CM | POA: Diagnosis not present

## 2017-01-27 DIAGNOSIS — M6281 Muscle weakness (generalized): Secondary | ICD-10-CM

## 2017-01-27 NOTE — Therapy (Signed)
[facility] [address] [place], [place], [address] Phone: [phone]   Fax:  [phone]  Physical Therapy Treatment  Patient Details  Name: Kara Schwartz MRN: [id number] Date of Birth: Nov 03, 1985 Referring Provider: Venita LickBrooks, Dahari, MD   Encounter Date: 01/27/2017  PT End of Session - 01/27/17 0854    Visit Number  3    Number of Visits  9    Date for PT Re-Evaluation  02/17/17    Authorization Type  Cigna/Cigna Managed    Authorization Time Period  01/20/17 - 02/17/17    PT Start Time  0817    PT Stop Time  0858    PT Time Calculation (min)  41 min    Activity Tolerance  Patient tolerated treatment well;No increased pain    Behavior During Therapy  WFL for tasks assessed/performed       Past Medical History:  Diagnosis Date  . Headache(784.0)   . Hypertriglyceridemia   . No pertinent past medical history     Past Surgical History:  Procedure Laterality Date  . AUGMENTATION MAMMAPLASTY Bilateral    implants  . BREAST ENHANCEMENT SURGERY  2008    There were no vitals filed for this visit.  Subjective Assessment - 01/27/17 0848    Subjective  Patient states that she is having a lot of pain that started yesterday particularly at night and when sleeping. Patient stated her pain is a 9/10 currently.  Patient stated that her pain is worst when she is sitting.     Limitations  Sitting;Reading;House hold activities;Lifting    How long can you sit comfortably?  immediate     How long can you stand comfortably?  unlimited    How long can you walk comfortably?  unlimited    Patient Stated Goals  be able to pick up my son without pain    Currently in Pain?  Yes    Pain Score  9     Pain Location  Neck    Pain Orientation  Right    Pain Descriptors / Indicators  Tightness    Pain Type  Chronic pain    Pain Onset  More than a month ago    Pain Frequency  Constant    Aggravating Factors   sitting, looking down, lifting    Pain Relieving Factors  Chin tucks and rubbing the area and TENS unit    Effect of Pain on Daily Activities  moderate    Multiple Pain Sites  No         [facility] PT Assessment - 01/27/17 0001      Sensation   Additional Comments  Hautard's test this session; negative Patient also did not report any history of dizziness                  [facility] Adult PT Treatment/Exercise - 01/27/17 0001      Neck Exercises: Standing   Other Standing Exercises  Standing scapular stabilzation window washing in the scapular plane x10 each direction each upper extremity      Neck Exercises: Seated   Neck Retraction  10 reps;5 secs    Other Seated Exercise  Cervical flexion to extension, cervical rotation each side, cervical lateral flexion each side x 5     Other Seated Exercise  Thoracic flexion to extension x 5  Noted tightness in thoracic region with this      Manual Therapy   Manual Therapy  Soft tissue mobilization;Myofascial  release    Manual therapy comments  completed seperately from all other skilled interventions    Joint Mobilization  Grade III posterior to anterior glides C5-T6 with 10 seconds of oscillation at each spinous process to reduce pain and improve mobility; to patient's tolerance    Soft tissue mobilization  Decrease pain and promote relaxation uppper levator scapula and upper trapezius in supine    Myofascial Release  Suboccipital release to patient's tolerance in supine             PT Education - 01/27/17 0853    Education provided  Yes    Education Details  Patient was educated about the purpose of each intervention while it was being performed.     Person(s) Educated  Patient    Methods  Explanation    Comprehension  Verbalized understanding       PT Short Term Goals - 01/20/17 1430      PT SHORT TERM GOAL #1   Title  Patient will be independent with HEP to progress towards goals and improve independence with exercises.     Time  2    Period  Weeks     Status  New    Target Date  02/03/17      PT SHORT TERM GOAL #2   Title  Patient will improve ROM by 10* in all planes for cervical spine to improve functional mobility and decrease pain during daily activities.     Time  2    Period  Weeks    Status  New        PT Long Term Goals - 01/20/17 1431      PT LONG TERM GOAL #1   Title  Patient will demosntrate 10 point improvement in NDI to demonstrate improved functinoal performance realted to neck mobility.    Time  4    Period  Weeks    Status  New    Target Date  02/17/17      PT LONG TERM GOAL #2   Title  Patient will be able to sit to put on her make-up and fix her hair with no pain to improve QOL.     Time  4    Period  Weeks    Status  New      PT LONG TERM GOAL #3   Title  Patient will improve cervical spine ROM in planes within functional limits to improve functional mobility with no increase in pain.     Time  4    Period  Weeks    Status  New      PT LONG TERM GOAL #4   Title  Patient will demonstrate proper lifting mechanics and be able to perform 10 repeated floor to waist height lifts with no increase in pain; patient will report being abel to pick her son up with no increase or delayed on set of pain throughout a one week period.    Time  4    Period  Weeks            Plan - 01/27/17 1044    Clinical Impression Statement  Patient reported an increased pain this session to 9/10 pain. Performed Hautard's assessment this session to assess for any difficulties and it was found to be negative. Patient also denied any history of dizziness with current problem.  Performed soft tissue mobilization, myofascial release, and joint mobilizations as described above in order to promote relaxation, decrease pain, and improve mobility  in the lower cervical and upper thoracic region. Patient tolerated all manual therapy well. Patient performed cervical retraction this session as she stated this seems to help most with her  pain. Added standing scapular stabilization exercise this session with a focus on keeping the shoulders from elevating with movement and to promote scapular strengthening and proper mechanics with upper extremity movement. Patient reported that her pain decreased by the end of the session to a 6/10. Patient would benefit from continued skilled physical therapy in order to address continued deficits in pain and range of motion.     Rehab Potential  Good    PT Frequency  2x / week    PT Duration  4 weeks    PT Treatment/Interventions  ADLs/Self Care Home Management;Electrical Stimulation;Traction;Moist Heat;Cryotherapy;Functional mobility training;Therapeutic activities;Therapeutic exercise;Neuromuscular re-education;Patient/family education;Manual techniques;Passive range of motion;Dry needling    PT Next Visit Plan  Continue PA to lower cervical spine and upper thoracic spine if with PT.  PRogress postural training and progress cervical retractions as appropriate. Patient will benefit from education on lifting techniques as mobility improves.    PT Home Exercise Plan  Eval: seated chin retractions; cervical flexion extension x 5; cervical lateral flexion each side x5; cervical rotation x 5 each direction    Consulted and Agree with Plan of Care  Patient       Patient will benefit from skilled therapeutic intervention in order to improve the following deficits and impairments:  Pain, Decreased mobility, Increased muscle spasms, Postural dysfunction, Improper body mechanics, Increased fascial restricitons, Decreased range of motion, Hypomobility, Impaired flexibility  Visit Diagnosis: Cervicalgia  Stiffness of neck  Muscle weakness (generalized)     Problem List Patient Active Problem List   Diagnosis Date Noted  . Migraine without aura and without status migrainosus, not intractable 08/14/2014  . Anxiety and depression 08/30/2012  . Migraines 08/02/2012   Verne Carrow PT, DPT 10:55 AM,  01/27/17 [phone]  [facility] [address] [place], [place], [address] Phone: [phone]   Fax:  [phone]  Name: CHYANN AMBROCIO MRN: [id number] Date of Birth: 1985-10-23

## 2017-01-31 ENCOUNTER — Telehealth (HOSPITAL_COMMUNITY): Payer: Self-pay | Admitting: Family Medicine

## 2017-01-31 NOTE — Telephone Encounter (Signed)
01/31/17  Patient left us a message to change the 1/29 appt.  I called her back but had to leave a message asking her to call back so we could reschedule the appt.

## 2017-02-01 ENCOUNTER — Encounter (HOSPITAL_COMMUNITY): Payer: Self-pay

## 2017-02-01 ENCOUNTER — Ambulatory Visit (HOSPITAL_COMMUNITY): Payer: Managed Care, Other (non HMO)

## 2017-02-01 DIAGNOSIS — M542 Cervicalgia: Secondary | ICD-10-CM

## 2017-02-01 DIAGNOSIS — M6281 Muscle weakness (generalized): Secondary | ICD-10-CM

## 2017-02-01 DIAGNOSIS — M436 Torticollis: Secondary | ICD-10-CM

## 2017-02-01 NOTE — Therapy (Signed)
[facility] [address] , [place], [address] Phone: [phone]   Fax:  [phone]  Physical Therapy Treatment  Patient Details  Name: Kara Schwartz MRN: [id number] Date of Birth: Jan 20, 1985 Referring Provider: Venita LickBrooks, Dahari, MD   Encounter Date: 02/01/2017  PT End of Session - 02/01/17 0827    Visit Number  4    Number of Visits  9    Date for PT Re-Evaluation  02/17/17    Authorization Type  Cigna/Cigna Managed    Authorization Time Period  01/20/17 - 02/17/17    PT Start Time  0820    PT Stop Time  0858    PT Time Calculation (min)  38 min    Activity Tolerance  Patient tolerated treatment well;No increased pain    Behavior During Therapy  WFL for tasks assessed/performed       Past Medical History:  Diagnosis Date  . Headache(784.0)   . Hypertriglyceridemia   . No pertinent past medical history     Past Surgical History:  Procedure Laterality Date  . AUGMENTATION MAMMAPLASTY Bilateral    implants  . BREAST ENHANCEMENT SURGERY  2008    There were no vitals filed for this visit.  Subjective Assessment - 02/01/17 0824    Subjective  Pt stated she feels a lot better than last session but felt better following.  Current pain scale 6-7/10 posterior neck.     Patient Stated Goals  be able to pick up my son without pain    Currently in Pain?  Yes    Pain Score  7     Pain Location  Neck    Pain Orientation  Posterior    Pain Descriptors / Indicators  Numbness;Sore    Pain Type  Chronic pain    Pain Onset  More than a month ago    Pain Frequency  Constant    Aggravating Factors   sitting, looking down, lifting    Pain Relieving Factors  Chin tucks and rubbing the area and TENS unit    Effect of Pain on Daily Activities  moderate                      [facility] Adult PT Treatment/Exercise - 02/01/17 0001      Neck Exercises: Seated   Neck Retraction  10 reps;5 secs    W Back  10 reps    Other  Seated Exercise  3D cervical excursion    Other Seated Exercise  3D thoracic excursion 5x      Manual Therapy   Manual Therapy  Soft tissue mobilization;Myofascial release    Manual therapy comments  completed seperately from all other skilled interventions    Soft tissue mobilization  Decrease pain and promote relaxation uppper levator scapula and upper trapezius in supine    Myofascial Release  Suboccipital release 3 sets               PT Short Term Goals - 01/20/17 1430      PT SHORT TERM GOAL #1   Title  Patient will be independent with HEP to progress towards goals and improve independence with exercises.     Time  2    Period  Weeks    Status  New    Target Date  02/03/17      PT SHORT TERM GOAL #2   Title  Patient will improve ROM by 10* in all planes for cervical  spine to improve functional mobility and decrease pain during daily activities.     Time  2    Period  Weeks    Status  New        PT Long Term Goals - 01/20/17 1431      PT LONG TERM GOAL #1   Title  Patient will demosntrate 10 point improvement in NDI to demonstrate improved functinoal performance realted to neck mobility.    Time  4    Period  Weeks    Status  New    Target Date  02/17/17      PT LONG TERM GOAL #2   Title  Patient will be able to sit to put on her make-up and fix her hair with no pain to improve QOL.     Time  4    Period  Weeks    Status  New      PT LONG TERM GOAL #3   Title  Patient will improve cervical spine ROM in planes within functional limits to improve functional mobility with no increase in pain.     Time  4    Period  Weeks    Status  New      PT LONG TERM GOAL #4   Title  Patient will demonstrate proper lifting mechanics and be able to perform 10 repeated floor to waist height lifts with no increase in pain; patient will report being abel to pick her son up with no increase or delayed on set of pain throughout a one week period.    Time  4    Period  Weeks             Plan - 02/01/17 1610    Clinical Impression Statement  Session focus on cervical mobility and postural strengthening to improve cervical stability.  Pt tolerated well with therex with minimal cueing for technqiue initially with exercises today.  EOS with manual soft tissue mobilization to address soft tissue restriction posterior cervical and upper back area.   Pt reports significant reduction in pain following suboccipital release.  Pain at 2/10 at EOS.      Rehab Potential  Good    PT Frequency  2x / week    PT Duration  4 weeks    PT Treatment/Interventions  ADLs/Self Care Home Management;Electrical Stimulation;Traction;Moist Heat;Cryotherapy;Functional mobility training;Therapeutic activities;Therapeutic exercise;Neuromuscular re-education;Patient/family education;Manual techniques;Passive range of motion;Dry needling    PT Next Visit Plan  Continue PA to lower cervical spine and upper thoracic spine if with PT.  PRogress postural training and progress cervical retractions as appropriate. Patient will benefit from education on lifting techniques as mobility improves.    PT Home Exercise Plan  Eval: seated chin retractions; cervical flexion extension x 5; cervical lateral flexion each side x5; cervical rotation x 5 each direction       Patient will benefit from skilled therapeutic intervention in order to improve the following deficits and impairments:  Pain, Decreased mobility, Increased muscle spasms, Postural dysfunction, Improper body mechanics, Increased fascial restricitons, Decreased range of motion, Hypomobility, Impaired flexibility  Visit Diagnosis: Cervicalgia  Stiffness of neck  Muscle weakness (generalized)     Problem List Patient Active Problem List   Diagnosis Date Noted  . Migraine without aura and without status migrainosus, not intractable 08/14/2014  . Anxiety and depression 08/30/2012  . Migraines 08/02/2012   Becky Sax, LPTA;  CBIS [phone]  Juel Burrow 02/01/2017, 12:52 PM   [facility] [address] [place], [place], [address] Phone: [phone]   Fax:  [phone]  Name: Kara Schwartz MRN: [id number] Date of Birth: 07-09-1985

## 2017-02-04 ENCOUNTER — Encounter (HOSPITAL_COMMUNITY): Payer: Self-pay | Admitting: Physical Therapy

## 2017-02-04 ENCOUNTER — Other Ambulatory Visit: Payer: Self-pay

## 2017-02-04 ENCOUNTER — Ambulatory Visit (HOSPITAL_COMMUNITY): Payer: Managed Care, Other (non HMO) | Attending: Orthopedic Surgery | Admitting: Physical Therapy

## 2017-02-04 DIAGNOSIS — M436 Torticollis: Secondary | ICD-10-CM | POA: Insufficient documentation

## 2017-02-04 DIAGNOSIS — M542 Cervicalgia: Secondary | ICD-10-CM | POA: Insufficient documentation

## 2017-02-04 DIAGNOSIS — M6281 Muscle weakness (generalized): Secondary | ICD-10-CM | POA: Diagnosis present

## 2017-02-04 NOTE — Therapy (Signed)
[facility] [address] [place], [place], [address] Phone: [phone]   Fax:  [phone]  Physical Therapy Treatment  Patient Details  Name: Kara Schwartz MRN: [id number] Date of Birth: 1985/12/07 Referring Provider: Venita LickBrooks, Dahari, MD   Encounter Date: 02/04/2017  PT End of Session - 02/04/17 2051    Visit Number  5    Number of Visits  9    Date for PT Re-Evaluation  02/17/17    Authorization Type  Cigna/Cigna Managed    Authorization Time Period  01/20/17 - 02/17/17    PT Start Time  1305    PT Stop Time  1345    PT Time Calculation (min)  40 min    Activity Tolerance  Patient tolerated treatment well;No increased pain    Behavior During Therapy  WFL for tasks assessed/performed       Past Medical History:  Diagnosis Date  . Headache(784.0)   . Hypertriglyceridemia   . No pertinent past medical history     Past Surgical History:  Procedure Laterality Date  . AUGMENTATION MAMMAPLASTY Bilateral    implants  . BREAST ENHANCEMENT SURGERY  2008    There were no vitals filed for this visit.  Subjective Assessment - 02/04/17 1317    Subjective  Patient stated that she is not having any pain today, however patient stated that after the last session she did have some mild numbness into her bilateral upper extremities and lower extremities that has since resolved.     Patient Stated Goals  be able to pick up my son without pain    Currently in Pain?  No/denies    Pain Score  0-No pain    Pain Onset  --                      [facility] Adult PT Treatment/Exercise - 02/04/17 0001      Neck Exercises: Standing   Other Standing Exercises  Standing snow angels against wall x 10 with emphasis on keeping chin tucked throughout    Other Standing Exercises  Shoulder flexion overhead with shoulder in external rotation x 10 with 1# bar emphasizing proper posture throughout      Neck Exercises: Seated   Neck Retraction  10  reps;5 secs    Cervical Rotation  Left;Right;Other (comment);10 reps 5 second holds    Lateral Flexion  10 reps;Right;Left;Other (comment) 5 second holds    W Back  10 reps    Other Seated Exercise  Cervical flexion to extension x 5    Other Seated Exercise  Scapular retractions x 10      Neck Exercises: Sidelying   Other Sidelying Exercise  Thoracic openers x 10 each upper extremity  Slow and controlled throughout the movement      Manual Therapy   Manual Therapy  Soft tissue mobilization;Passive ROM    Manual therapy comments  completed seperately from all other skilled interventions    Soft tissue mobilization  Promote relaxation and improve range of motion in supine with bolster under patient's legs performed to upper trapezius and levator scapulae    Passive ROM  Cervical and capital rotation and cervical lateral flexion with patient positioned in supine with legs over bolster             PT Education - 02/04/17 2050    Education provided  Yes    Education Details  Patient was educated about the purpose of each  intervention and proper technique of exercises throughout.     Person(s) Educated  Patient    Methods  Explanation;Demonstration;Verbal cues    Comprehension  Verbalized understanding;Returned demonstration       PT Short Term Goals - 01/20/17 1430      PT SHORT TERM GOAL #1   Title  Patient will be independent with HEP to progress towards goals and improve independence with exercises.     Time  2    Period  Weeks    Status  New    Target Date  02/03/17      PT SHORT TERM GOAL #2   Title  Patient will improve ROM by 10* in all planes for cervical spine to improve functional mobility and decrease pain during daily activities.     Time  2    Period  Weeks    Status  New        PT Long Term Goals - 01/20/17 1431      PT LONG TERM GOAL #1   Title  Patient will demosntrate 10 point improvement in NDI to demonstrate improved functinoal performance realted  to neck mobility.    Time  4    Period  Weeks    Status  New    Target Date  02/17/17      PT LONG TERM GOAL #2   Title  Patient will be able to sit to put on her make-up and fix her hair with no pain to improve QOL.     Time  4    Period  Weeks    Status  New      PT LONG TERM GOAL #3   Title  Patient will improve cervical spine ROM in planes within functional limits to improve functional mobility with no increase in pain.     Time  4    Period  Weeks    Status  New      PT LONG TERM GOAL #4   Title  Patient will demonstrate proper lifting mechanics and be able to perform 10 repeated floor to waist height lifts with no increase in pain; patient will report being abel to pick her son up with no increase or delayed on set of pain throughout a one week period.    Time  4    Period  Weeks            Plan - 02/04/17 2112    Clinical Impression Statement  This session continued to focus on improving cervical mobility and postural strengthening. Patient was educated to follow up with her physician regarding the mild numbness she was feeling in her bilateral upper and lower extremities and patient agreed. Patient denied any tingling or numbness throughout session and patient denied any dizziness. Session included manual therapy for cervical mobility with an emphasis on passive range of motion with some soft tissue mobilization. The remainder of the session included therapeutic exercises to allow patient to move through available range of motion independently. Patient performed exercises at a very slow and controlled pace throughout session. Patient would benefit from continued skilled physical therapy to continue to address deficits in range of motion, posture, and overall functional mobility.     Rehab Potential  Good    PT Frequency  2x / week    PT Duration  4 weeks    PT Treatment/Interventions  ADLs/Self Care Home Management;Electrical Stimulation;Traction;Moist  Heat;Cryotherapy;Functional mobility training;Therapeutic activities;Therapeutic exercise;Neuromuscular re-education;Patient/family education;Manual techniques;Passive range of motion;Dry needling  PT Next Visit Plan  Ask if patient experienced any more numbness since last session. Consider contining PA to lower cervical spine and upper thoracic spine if with PT.  PRogress postural training and progress cervical retractions as appropriate. Continue progression of overhead and lifting activites with focus on proper posture. Patient will benefit from education on lifting techniques as mobility improves.    PT Home Exercise Plan  Eval: seated chin retractions; cervical flexion extension x 5; cervical lateral flexion each side x5; cervical rotation x 5 each direction    Consulted and Agree with Plan of Care  Patient       Patient will benefit from skilled therapeutic intervention in order to improve the following deficits and impairments:  Pain, Decreased mobility, Increased muscle spasms, Postural dysfunction, Improper body mechanics, Increased fascial restricitons, Decreased range of motion, Hypomobility, Impaired flexibility  Visit Diagnosis: Cervicalgia  Stiffness of neck  Muscle weakness (generalized)     Problem List Patient Active Problem List   Diagnosis Date Noted  . Migraine without aura and without status migrainosus, not intractable 08/14/2014  . Anxiety and depression 08/30/2012  . Migraines 08/02/2012   Verne Carrow PT, DPT 9:35 PM, 02/04/17 [phone]  [facility] [address] [place], [place], [address] Phone: [phone]   Fax:  [phone]  Name: Kara Schwartz MRN: [id number] Date of Birth: Nov 20, 1985

## 2017-02-05 ENCOUNTER — Other Ambulatory Visit: Payer: Self-pay | Admitting: Neurology

## 2017-02-07 ENCOUNTER — Ambulatory Visit (HOSPITAL_COMMUNITY): Payer: Managed Care, Other (non HMO)

## 2017-02-07 ENCOUNTER — Encounter (HOSPITAL_COMMUNITY): Payer: Self-pay

## 2017-02-07 ENCOUNTER — Other Ambulatory Visit: Payer: Self-pay

## 2017-02-07 DIAGNOSIS — M6281 Muscle weakness (generalized): Secondary | ICD-10-CM

## 2017-02-07 DIAGNOSIS — M542 Cervicalgia: Secondary | ICD-10-CM

## 2017-02-07 DIAGNOSIS — M436 Torticollis: Secondary | ICD-10-CM

## 2017-02-07 NOTE — Therapy (Signed)
[facility] [address] [place], [place], [address] Phone: [phone]   Fax:  [phone]  Physical Therapy Treatment  Patient Details  Name: Kara Schwartz MRN: [id number] Date of Birth: 15-Mar-1985 Referring Provider: Venita Lick, MD   Encounter Date: 02/07/2017  PT End of Session - 02/07/17 1111    Visit Number  6    Number of Visits  9    Date for PT Re-Evaluation  02/17/17    Authorization Type  Cigna/Cigna Managed    Authorization Time Period  01/20/17 - 02/17/17    PT Start Time  0906    PT Stop Time  0946    PT Time Calculation (min)  40 min    Activity Tolerance  Patient tolerated treatment well;No increased pain    Behavior During Therapy  WFL for tasks assessed/performed       Past Medical History:  Diagnosis Date  . Headache(784.0)   . Hypertriglyceridemia   . No pertinent past medical history     Past Surgical History:  Procedure Laterality Date  . AUGMENTATION MAMMAPLASTY Bilateral    implants  . BREAST ENHANCEMENT SURGERY  2008    There were no vitals filed for this visit.  Subjective Assessment - 02/07/17 0932    Subjective  Patient reports she is still having intermittent numbness and tingling into Bil UE/LE but cannot determine what movements/activities provoke this feeling. She has been performing her exercises each day and is using her bowflex at home for other exercise. She reports she has neck pain/tightness/pressure when doing biceps curls with ~ 25 lbs in each arm.    Limitations  Sitting;Reading;House hold activities;Lifting    Diagnostic tests  X-Ray, patient reports MD told her she has slightly decreased curve "similar to whiplash"    Patient Stated Goals  be able to pick up my son without pain    Currently in Pain?  No/denies         [facility] Adult PT Treatment/Exercise - 02/07/17 0001      Therapeutic Activites    Therapeutic Activities  Lifting    Lifting  10 reos lifting from 12" box to  chest height with 5-10 lbs weight      Neck Exercises: Theraband   Rows  Red;15 reps      Neck Exercises: Standing   Upper Extremity Flexion with Stabilization  Limitations    UE Flexion with Stabilization Limitations  bicep curls, 10 repbs Bil UE with 8 lbs    Wall Wash  1x Bil UE for 10 reps clockkwise/counterclockwise    Other Standing Exercises  Standing snow angels against wall x 10 with emphasis on keeping chin tucked throughout    Other Standing Exercises  Wall slides with lift off for 3 secondsx 15 reps      Neck Exercises: Seated   Neck Retraction  15 reps;5 secs;Limitations    Neck Retraction Limitations  fingertip in front for tactile cues    Other Seated Exercise  `      Manual Therapy   Manual Therapy  Soft tissue mobilization;Passive ROM    Manual therapy comments  completed seperately from all other skilled interventions    Myofascial Release  Suboccipital release 3 sets        PT Education - 02/07/17 1116    Education provided  Yes    Education Details  Educated on form throughout session. Educated on tectile cue for chin tucks seated. Instructed in proper lifting mechanics  and educated to decrease weight of bowflex machine.    Person(s) Educated  Patient    Methods  Explanation    Comprehension  Verbalized understanding;Returned demonstration       PT Short Term Goals - 01/20/17 1430      PT SHORT TERM GOAL #1   Title  Patient will be independent with HEP to progress towards goals and improve independence with exercises.     Time  2    Period  Weeks    Status  New    Target Date  02/03/17      PT SHORT TERM GOAL #2   Title  Patient will improve ROM by 10* in all planes for cervical spine to improve functional mobility and decrease pain during daily activities.     Time  2    Period  Weeks    Status  New        PT Long Term Goals - 01/20/17 1431      PT LONG TERM GOAL #1   Title  Patient will demosntrate 10 point improvement in NDI to demonstrate  improved functinoal performance realted to neck mobility.    Time  4    Period  Weeks    Status  New    Target Date  02/17/17      PT LONG TERM GOAL #2   Title  Patient will be able to sit to put on her make-up and fix her hair with no pain to improve QOL.     Time  4    Period  Weeks    Status  New      PT LONG TERM GOAL #3   Title  Patient will improve cervical spine ROM in planes within functional limits to improve functional mobility with no increase in pain.     Time  4    Period  Weeks    Status  New      PT LONG TERM GOAL #4   Title  Patient will demonstrate proper lifting mechanics and be able to perform 10 repeated floor to waist height lifts with no increase in pain; patient will report being abel to pick her son up with no increase or delayed on set of pain throughout a one week period.    Time  4    Period  Weeks         Plan - 02/07/17 1111    Clinical Impression Statement  Patient is progressing in therapy however continues to report intermittent Bil UE/LE numbness and tingling at unpredictable times. She denied symptoms throughout the session and reports she had none yesterday. She was educated on decreased load for UE exercises in home gym to see if this reduces "pressure"/ "tightness" in lower neck. She advanced postural training today with no increase in pain but continues to require visual/tactile cues for proper chin tuck form. Patient will continue to benefit from skilled physical therapy to continue to address deficits in range of motion, posture, and overall functional mobility.    Rehab Potential  Good    PT Frequency  2x / week    PT Duration  4 weeks    PT Treatment/Interventions  ADLs/Self Care Home Management;Electrical Stimulation;Traction;Moist Heat;Cryotherapy;Functional mobility training;Therapeutic activities;Therapeutic exercise;Neuromuscular re-education;Patient/family education;Manual techniques;Passive range of motion;Dry needling    PT Next Visit  Plan  Continue to ask patient if she is experiencing any more numbness since last session. Consider continuing PA to lower cervical spine and upper thoracic spine if  with PT. Continue with postural training using theraband and progress cervical retractions as appropriate. Ask patient how she felt after low weight bicep curls and create bicep curl with pulley for HEP (patient uses Bowflex). Continue wall wash and overhead endurance exercises, initiate arm circles, continue with lifting mechanics.     PT Home Exercise Plan  Eval: seated chin retractions; cervical flexion extension x 5; cervical lateral flexion each side x5; cervical rotation x 5 each direction    Consulted and Agree with Plan of Care  Patient       Patient will benefit from skilled therapeutic intervention in order to improve the following deficits and impairments:  Pain, Decreased mobility, Increased muscle spasms, Postural dysfunction, Improper body mechanics, Increased fascial restricitons, Decreased range of motion, Hypomobility, Impaired flexibility  Visit Diagnosis: No diagnosis found.     Problem List Patient Active Problem List   Diagnosis Date Noted  . Migraine without aura and without status migrainosus, not intractable 08/14/2014  . Anxiety and depression 08/30/2012  . Migraines 08/02/2012    Valentino Saxonachel Quinn-Brown, PT, DPT Physical Therapist with [facility]  02/07/2017 12:05 PM    [facility] [address] [place], [place], [address] Phone: [phone]   Fax:  [phone]  Name: Jimmey RalphVictoria W Shenker MRN: [id number] Date of Birth: 1985-04-06

## 2017-02-09 ENCOUNTER — Encounter (HOSPITAL_COMMUNITY): Payer: Self-pay

## 2017-02-09 ENCOUNTER — Ambulatory Visit (HOSPITAL_COMMUNITY): Payer: Managed Care, Other (non HMO)

## 2017-02-09 ENCOUNTER — Other Ambulatory Visit: Payer: Self-pay

## 2017-02-09 DIAGNOSIS — M436 Torticollis: Secondary | ICD-10-CM

## 2017-02-09 DIAGNOSIS — M542 Cervicalgia: Secondary | ICD-10-CM

## 2017-02-09 DIAGNOSIS — M6281 Muscle weakness (generalized): Secondary | ICD-10-CM

## 2017-02-09 NOTE — Therapy (Signed)
[facility] [address] [place], [place], [address] Phone: [phone]   Fax:  [phone]  Physical Therapy Treatment  Patient Details  Name: Kara Schwartz MRN: [id number] Date of Birth: 02/13/1985 Referring Provider: Venita LickBrooks, Dahari, MD   Encounter Date: 02/09/2017  PT End of Session - 02/09/17 0824    Visit Number  7    Number of Visits  9    Date for PT Re-Evaluation  02/17/17    Authorization Type  Cigna/C[employer]a Managed    Authorization Time Period  01/20/17 - 02/17/17    PT Start Time  0824 patient arrived late    PT Stop Time  0904    PT Time Calculation (min)  40 min    Activity Tolerance  Patient tolerated treatment well;No increased pain    Behavior During Therapy  WFL for tasks assessed/performed       Past Medical History:  Diagnosis Date  . Headache(784.0)   . Hypertriglyceridemia   . No pertinent past medical history     Past Surgical History:  Procedure Laterality Date  . AUGMENTATION MAMMAPLASTY Bilateral    implants  . BREAST ENHANCEMENT SURGERY  2008    There were no vitals filed for this visit.  Subjective Assessment - 02/09/17 0845    Subjective  Patient arrives stating she tried to pay attention to her intermittent numbness and tingling into Bil UE/LE and states it started in her legs while she was driving her son to school this morning. She states it was lingering all day and evening yesterday in her legs but that she hasn't had as much in her arms in the last few days. She states after the low weight bicep curl she did not have as much pain or "tightness" in her neck as she did when she used her bowlfex with high weight.     Limitations  Sitting;Reading;House hold activities;Lifting    Diagnostic tests  X-Ray, patient reports MD told her she has slightly decreased curve "similar to whiplash"    Patient Stated Goals  be able to pick up my son without pain    Currently in Pain?  No/denies        [facility]  [facility]ult [facility] Treatment/Exercise - 02/09/17 0001      Neck Exercises: Machines for Strengthening   UBE (Upper Arm Bike)  4 minutes retrograde on lvel 1 for postural endurance strengthening    Power Tower  Cybex tower: 2x 10 reps for deadlift wtih bilateral UE bicep curl using 2 plates for lifting mechanics      Neck Exercises: Theraband   Shoulder Extension  15 reps;Red    Rows  Red;15 reps      Neck Exercises: Standing   Other Standing Exercises  Standing snow angels against wall x 15 with emphasis on keeping chin tucked throughout      Neck Exercises: Seated   Neck Retraction  15 reps;5 secs;Limitations    Neck Retraction Limitations  fingertip in front for tactile cues      Manual Therapy   Manual Therapy  Soft tissue mobilization;Passive ROM    Manual therapy comments  completed seperately from all other skilled interventions    Joint Mobilization  Grade III central PA glides C5-T6, and C2 unilateral PAwith 3x 30 second oscillation at each spinous process to reduce pain and improve mobility        PT Education - 02/09/17 0852    Education provided  Yes  Education Details  Educateed on form/technique through session. Educated on proper liftin gmechanics with squat using wighted pully/cybex machine. Updated HEP with bowlfex exercise.     Person(s) Educated  Patient    Methods  Explanation;Handout    Comprehension  Verbalized understanding;Returned demonstration       PT Short Term Goals - 01/20/17 1430      PT SHORT TERM GOAL #1   Title  Patient will be independent with HEP to progress towards goals and improve independence with exercises.     Time  2    Period  Weeks    Status  New    Target Date  02/03/17      PT SHORT TERM GOAL #2   Title  Patient will improve ROM by 10* in all planes for cervical spine to improve functional mobility and decrease pain during daily activities.     Time  2    Period  Weeks    Status  New        PT Long Term Goals - 01/20/17 1431       PT LONG TERM GOAL #1   Title  Patient will demosntrate 10 point improvement in NDI to demonstrate improved functinoal performance realted to neck mobility.    Time  4    Period  Weeks    Status  New    Target Date  02/17/17      PT LONG TERM GOAL #2   Title  Patient will be able to sit to put on her make-up and fix her hair with no pain to improve QOL.     Time  4    Period  Weeks    Status  New      PT LONG TERM GOAL #3   Title  Patient will improve cervical spine ROM in planes within functional limits to improve functional mobility with no increase in pain.     Time  4    Period  Weeks    Status  New      PT LONG TERM GOAL #4   Title  Patient will demonstrate proper lifting mechanics and be able to perform 10 repeated floor to waist height lifts with no increase in pain; patient will report being abel to pick her son up with no increase or delayed on set of pain throughout a one week period.    Time  4    Period  Weeks        Plan - 02/09/17 1610    Clinical Impression Statement  Patient continues to progress in therapy but reports ongoing intermittent Bil UE/LE numbness and tingling at unpredictable times. I encouraged her to discuss this with her MD. She reported a decrease of bil LE numbness and tingling in prone during PA mobilization to cervical/thoracic spine, and an increase with standing. Exercises this session focused on postural strengthening and lifting mechanics and she was able to advance functional strengthening with no pain. Patient will continue to benefit from skilled physical therapy to continue to address deficits in range of motion, posture, and overall functional mobility. Patient has two more session scheduled and will be re-assessed next week.    Rehab Potential  Good    PT Frequency  2x / week    PT Duration  4 weeks    PT Treatment/Interventions  ADLs/Self Care Home Management;Electrical Stimulation;Traction;Moist Heat;Cryotherapy;Functional mobility  training;Therapeutic activities;Therapeutic exercise;Neuromuscular re-education;Patient/family education;Manual techniques;Passive range of motion;Dry needling    PT Next Visit Plan  Continue to ask patient if she is experiencing any more numbness since last session. Continue PA to lower cervical spine and upper thoracic spine if with PT. Continue with postural training using theraband and progress cervical retractions as appropriate. Continue with UBE for endurance, initiate arm circles, continue with lifting mechanics.     PT Home Exercise Plan  Eval: seated chin retractions; cervical flexion extension x 5; cervical lateral flexion each side x5; cervical rotation x 5 each direction; 02/09/17 - deadlift with bicep curl    Consulted and Agree with Plan of Care  Patient       Patient will benefit from skilled therapeutic intervention in order to improve the following deficits and impairments:  Pain, Decreased mobility, Increased muscle spasms, Postural dysfunction, Improper body mechanics, Increased fascial restricitons, Decreased range of motion, Hypomobility, Impaired flexibility  Visit Diagnosis: Cervicalgia  Stiffness of neck  Muscle weakness (generalized)     Problem List Patient Active Problem List   Diagnosis Date Noted  . Migraine without aura and without status migrainosus, not intractable 08/14/2014  . Anxiety and depression 08/30/2012  . Migraines 08/02/2012    Valentino Saxon, PT, DPT Physical Therapist with [facility]  02/09/2017 11:31 AM    [facility] [address] [place], [place], [address] Phone: [phone]   Fax:  [phone]  Name: Kara Schwartz MRN: [id number] Date of Birth: 10-05-1985

## 2017-02-09 NOTE — Patient Instructions (Addendum)
    Deadlift with cable bicep curl: 1-2 sets of 10-15 reps ( begin with 5 lbs in each hand)   Position cable machine so that cable is located on setting closest to the floor. Grasp rope and position self 2-3 feet away from machine.   Stand while holding a cable/pulley in each hand in a squat position.  Maintain good squat form by keeping your back straight and do not allow it to round forward. Hinge and bend through your hips and then, straighten back up to a standing upright position.   Be sure to keep your back straight and hinge/bend through your hips.  Keep the dumbbell close to your body to avoid excessive pressure on your back.    With hands in a neutral position flex elbow to bring rope towards your chest. At the same time, rotate forearms so that palms are facing upward. Return to resting position and repeat, making sure to do so in a slow and controlled manner.   Be sure to keep back flat and to keep elbows at your side. Select a weight that will lead to fatigue at the end of the final set. DO NOT select a weight that causes you to lean backwards to complete motion.

## 2017-02-14 ENCOUNTER — Other Ambulatory Visit: Payer: Self-pay

## 2017-02-14 ENCOUNTER — Ambulatory Visit (HOSPITAL_COMMUNITY): Payer: Managed Care, Other (non HMO)

## 2017-02-14 ENCOUNTER — Encounter (HOSPITAL_COMMUNITY): Payer: Self-pay

## 2017-02-14 DIAGNOSIS — M6281 Muscle weakness (generalized): Secondary | ICD-10-CM

## 2017-02-14 DIAGNOSIS — M542 Cervicalgia: Secondary | ICD-10-CM

## 2017-02-14 DIAGNOSIS — M436 Torticollis: Secondary | ICD-10-CM

## 2017-02-14 NOTE — Therapy (Signed)
[facility] [address] [place], [place], [address] Phone: [phone]   Fax:  [phone]  Physical Therapy Treatment  Patient Details  Name: Kara Schwartz MRN: [id number] Date of Birth: [birth year] Referring Provider: Venita LickBrooks, Dahari, MD   Encounter Date: 02/14/2017  PT End of Session - 02/14/17 0916    Visit Number  8    Number of Visits  9    Date for PT Re-Evaluation  02/17/17    Authorization Type  Cigna/Cigna Managed    Authorization Time Period  01/20/17 - 02/17/17    PT Start Time  0906    PT Stop Time  0946    PT Time Calculation (min)  40 min    Activity Tolerance  Patient tolerated treatment well;No increased pain    Behavior During Therapy  WFL for tasks assessed/performed       Past Medical History:  Diagnosis Date  . Headache(784.0)   . Hypertriglyceridemia   . No pertinent past medical history     Past Surgical History:  Procedure Laterality Date  . AUGMENTATION MAMMAPLASTY Bilateral    implants  . BREAST ENHANCEMENT SURGERY  2008    There were no vitals filed for this visit.  Subjective Assessment - 02/14/17 0911    Subjective  Patient reports her neck and back felt much better after last session. She reports standing seems to make her numbess come on more than sitting now. She is using the TENS unit at least 1x per day to help with her pain. She reports after the last session she slept straight through the night for 2 nights in a row which she hasn't doen in a while and that her numbness/tingling has been less frequent. She states her low back has been sore after soem of her exericses but it is not as bad as it was before she started therapy.    Limitations  Sitting;Reading;House hold activities;Lifting    Diagnostic tests  X-Ray, patient reports MD told her she has slightly decreased curve "similar to whiplash"    Patient Stated Goals  be able to pick up my son without pain    Currently in Pain?  No/denies         [facility] Adult PT Treatment/Exercise - 02/14/17 0001      Neck Exercises: Machines for Strengthening   UBE (Upper Arm Bike)  4 minutes retrograde on level 1 for postural endurance strengthening    Power Tower  Cybex tower: 2x 10 reps for deadlift wtih bilateral UE bicep curl using 2 plates for lifting mechanics      Neck Exercises: Theraband   Scapula Retraction  15 reps;Red;Limitations    Scapula Retraction Limitations  high row     Shoulder Extension  15 reps;Red    Rows  Red;15 reps      Neck Exercises: Seated   Neck Retraction  15 reps;5 secs;Limitations    Neck Retraction Limitations  fingertip in front for tactile cues      Neck Exercises: Prone   Other Prone Exercise  I's, Y's, T's 10 reps each with no weight      Manual Therapy   Manual Therapy  Joint mobilization    Manual therapy comments  completed seperately from all other skilled interventions    Joint Mobilization  Grade III central PA glides C5-T6, and C2 unilateral PAwith 3x 30 second oscillation at each spinous process to reduce pain and improve mobility       PT  Education - 02/14/17 0932    Education provided  Yes    Education Details  Educated on exercise/technique and DOMS with HEP participation.    Person(s) Educated  Patient    Methods  Explanation    Comprehension  Verbalized understanding       PT Short Term Goals - 01/20/17 1430      PT SHORT TERM GOAL #1   Title  Patient will be independent with HEP to progress towards goals and improve independence with exercises.     Time  2    Period  Weeks    Status  New    Target Date  02/03/17      PT SHORT TERM GOAL #2   Title  Patient will improve ROM by 10* in all planes for cervical spine to improve functional mobility and decrease pain during daily activities.     Time  2    Period  Weeks    Status  New        PT Long Term Goals - 01/20/17 1431      PT LONG TERM GOAL #1   Title  Patient will demosntrate 10 point improvement in NDI to  demonstrate improved functinoal performance realted to neck mobility.    Time  4    Period  Weeks    Status  New    Target Date  02/17/17      PT LONG TERM GOAL #2   Title  Patient will be able to sit to put on her make-up and fix her hair with no pain to improve QOL.     Time  4    Period  Weeks    Status  New      PT LONG TERM GOAL #3   Title  Patient will improve cervical spine ROM in planes within functional limits to improve functional mobility with no increase in pain.     Time  4    Period  Weeks    Status  New      PT LONG TERM GOAL #4   Title  Patient will demonstrate proper lifting mechanics and be able to perform 10 repeated floor to waist height lifts with no increase in pain; patient will report being abel to pick her son up with no increase or delayed on set of pain throughout a one week period.    Time  4    Period  Weeks       Plan - 02/14/17 1610    Clinical Impression Statement  Patient is progressing well in therapy and reported decreased Bil UE/LE numbness and tingling after last session. She continues to report the numbness occurs more in standing now and that once it begins it takes a long time to go away. She continued to advance postural strengthening and lifting mechanics this session with no pain. She continues to report positive response to manual therapy with no report of symptoms following PA's to cervical/thoracic spine in prone. Patient will continue to benefit from skilled physical therapy to continue to address deficits in range of motion, posture, and overall functional mobility.     Rehab Potential  Good    PT Frequency  2x / week    PT Duration  4 weeks    PT Treatment/Interventions  ADLs/Self Care Home Management;Electrical Stimulation;Traction;Moist Heat;Cryotherapy;Functional mobility training;Therapeutic activities;Therapeutic exercise;Neuromuscular re-education;Patient/family education;Manual techniques;Passive range of motion;Dry needling    PT  Next Visit Plan  Re-assess next session and potentially discharge. Update HEP  with postural strengthening. Continue to ask patient if she is experiencing any more numbness since last session. Continue PA to lower cervical spine and upper thoracic spine if with PT. Continue with postural training using theraband and progress cervical retractions as appropriate. Continue with UBE for endurance, initiate arm circles, continue with lifting mechanics.     PT Home Exercise Plan  Eval: seated chin retractions; cervical flexion extension x 5; cervical lateral flexion each side x5; cervical rotation x 5 each direction; 02/09/17 - deadlift with bicep curl    Consulted and Agree with Plan of Care  Patient       Patient will benefit from skilled therapeutic intervention in order to improve the following deficits and impairments:  Pain, Decreased mobility, Increased muscle spasms, Postural dysfunction, Improper body mechanics, Increased fascial restricitons, Decreased range of motion, Hypomobility, Impaired flexibility  Visit Diagnosis: Cervicalgia  Stiffness of neck  Muscle weakness (generalized)     Problem List Patient Active Problem List   Diagnosis Date Noted  . Migraine without aura and without status migrainosus, not intractable 08/14/2014  . Anxiety and depression 08/30/2012  . Migraines 08/02/2012    Valentino Saxon, PT, DPT Physical Therapist with [facility]  02/14/2017 9:34 AM    [facility] [address] [place], [place], [address] Phone: [phone]   Fax:  [phone]  Name: Kara Schwartz MRN: [id number] Date of Birth: 07-04-1985

## 2017-02-16 ENCOUNTER — Ambulatory Visit (HOSPITAL_COMMUNITY): Payer: Managed Care, Other (non HMO)

## 2017-02-16 ENCOUNTER — Other Ambulatory Visit: Payer: Self-pay

## 2017-02-16 ENCOUNTER — Encounter (HOSPITAL_COMMUNITY): Payer: Self-pay

## 2017-02-16 DIAGNOSIS — M6281 Muscle weakness (generalized): Secondary | ICD-10-CM

## 2017-02-16 DIAGNOSIS — M542 Cervicalgia: Secondary | ICD-10-CM | POA: Diagnosis not present

## 2017-02-16 DIAGNOSIS — M436 Torticollis: Secondary | ICD-10-CM

## 2017-02-16 NOTE — Patient Instructions (Addendum)
   Thoracic Extension: 3 different levels , 10 times each level   Start by positioning the foam roller under your shoulder blades with your arms behind your head and hips down on the ground.  Knees should be bent and feet flat.  Gently lean back over the roller to extend your spine.  Try this at multiple sections of your back from the top to the bottom of your shoulder blades.       BODY MECHANICS - WAIST HEIGHT LIFTING:   Start by standing close to the object with feet spread apart. Bend at the knees and hips and NOT at your spine.   Hold the object close to your body as you use your legs muscles to stand back up lifting the object.   Walk over to the surface you want to set the object on to and set it down. Be sure to NOT twist your spine but to pivot your feet so that your feet are pointed forward to where you want to set the object.   Slide the object on the shelf to off load your body.     Additional handout for

## 2017-02-16 NOTE — Therapy (Signed)
[place], [place], [address] Phone: [phone]   Fax:  [phone]  Physical Therapy Treatment/Discharge Summary  Patient Details  Name: Kara Schwartz MRN: [id number] Date of Birth: [birth year] Referring Provider: Melina Schools MD  PHYSICAL THERAPY DISCHARGE SUMMARY  Visits from Start of Care: 9  Current functional level related to goals / functional outcomes: Re-assessment performed today and patient has met all short term and long term goals. Her cervical ROM is within functional limits in all planes and she denies pain/discomfort with movement. She has demonstrated safe lifting mechanics and had no "straining" or pain in her neck after 10 reps. Her NDI score improved by 14 points demonstrating a significant improvement in function. She was educated on comprehensive HEP to facilitate self-mobilization of thoracic spine and postural strengthening. [place] reported feeling confident in her ability to continue progressing independently and agrees she is ready to discharge from therapy. She will be discharged after this session.   Remaining deficits: See below details   Education / Equipment: Reveiwed progress towards goals and discussed patient's discharge plan. Updated HEP for comprehensive home program.   Plan: Patient agrees to discharge.  Patient goals were met. Patient is being discharged due to meeting the stated rehab goals.  ?????      Encounter Date: 02/16/2017  PT End of Session - 02/16/17 0910    Visit Number  9    Number of Visits  9    Date for PT Re-Evaluation  02/17/17    Authorization Type  Cigna/Cigna Managed    Authorization Time Period  01/20/17 - 02/17/17    PT Start Time  0906 patietn 5 minutes late    PT Stop Time  7544    PT Time Calculation (min)  38 min    Activity Tolerance  Patient tolerated treatment well;No increased pain    Behavior During Therapy  WFL for tasks  assessed/performed       Past Medical History:  Diagnosis Date  . Headache(784.0)   . Hypertriglyceridemia   . No pertinent past medical history     Past Surgical History:  Procedure Laterality Date  . AUGMENTATION MAMMAPLASTY Bilateral    implants  . BREAST ENHANCEMENT SURGERY  2008    There were no vitals filed for this visit.  Subjective Assessment - 02/16/17 0910    Subjective  Patient states she has not had any neck pain for a little over a week. She states she has less numbness/tingling in her Lt arm and still has some in her Bil LE below her knee but that it has gotten better too.    Limitations  Sitting;Reading;House hold activities;Lifting    Diagnostic tests  X-Ray, patient reports MD told her she has slightly decreased curve "similar to whiplash"    Patient Stated Goals  be able to pick up my son without pain    Currently in Pain?  No/denies       [facility] PT Assessment - 02/16/17 0001      Assessment   Medical Diagnosis  [place] of neck Muscles    Referring Provider  Melina Schools MD    Onset Date/Surgical Date  10/20/16 approximate    Hand Dominance  Right    Next MD Visit  none as of now    Prior Therapy  none      Precautions   Precautions  None      Restrictions   Weight Bearing Restrictions  No  Observation/Other Assessments   Neck Disability Index   8/50 22/50 on 01/20/17      Sensation   Additional Comments  Reports numbness and tingling in left forearm and hand and bil lower legs      AROM   Cervical Flexion  50    Cervical Extension  42    Cervical - Right Side Bend  45    Cervical - Left Side Bend  45    Cervical - Right Rotation  60    Cervical - Left Rotation  60      Strength   Overall Strength  Within functional limits for tasks performed    Overall Strength Comments  MMT 5/5 for BUE throughout    Strength Assessment Site  Cervical    Cervical Flexion  -- cervical endurance test 22 seconds      Palpation   Spinal mobility   improved mobility of cervical spine and thoracic spine, pateint remains hypomobile along C7-T3    Palpation comment  no noted tenderness along cervical paraspinals or bil upper traps, no palpable bands or knots       [facility] Adult PT Treatment/Exercise - 02/16/17 0001      Neck Exercises: Theraband   Scapula Retraction  10 reps;Green    Scapula Retraction Limitations  high row     Shoulder Extension  10 reps;Green    Rows  10 reps;Green      Neck Exercises: Supine   Other Supine Exercise  Thoracic extension for self mobilization: 3 levels over twoel roll, 10 ulnilatearl arm raises overhead at each level       PT Education - 02/16/17 0913    Education provided  Yes    Education Details  Reveiwed progress towards goals and discussed patient's discharge plan. Updated HEP for comprehensive home program.    Person(s) Educated  Patient    Methods  Explanation;Handout    Comprehension  Verbalized understanding       PT Short Term Goals - 02/16/17 0912      PT SHORT TERM GOAL #1   Title  Patient will be independent with HEP to progress towards goals and improve independence with exercises.     Time  2    Period  Weeks    Status  Achieved      PT SHORT TERM GOAL #2   Title  Patient will improve ROM by 10* in all planes for cervical spine to improve functional mobility and decrease pain during daily activities.     Baseline  02/16/17 - all motions WFL's    Time  2    Period  Weeks    Status  Achieved       PT Long Term Goals - 02/16/17 0912      PT LONG TERM GOAL #1   Title  Patient will demosntrate 10 point improvement in NDI to demonstrate improved functinoal performance realted to neck mobility.    Baseline  12/16/17 - was 22/50 now is 8/50    Time  4    Period  Weeks    Status  Achieved      PT LONG TERM GOAL #2   Title  Patient will be able to sit to put on her make-up and fix her hair with no pain to improve QOL.     Baseline  02/16/17 - no pain since last 2 sessions     Time  4    Period  Weeks    Status  Achieved      PT LONG TERM GOAL #3   Title  Patient will improve cervical spine ROM in planes within functional limits to improve functional mobility with no increase in pain.     Time  4    Period  Weeks    Status  Achieved      PT LONG TERM GOAL #4   Title  Patient will demonstrate proper lifting mechanics and be able to perform 10 repeated floor to waist height lifts with no increase in pain; patient will report being abel to pick her son up with no increase or delayed on set of pain throughout a one week period.    Baseline  02/16/17 -     Time  4    Period  Weeks    Status  Achieved       Plan - 02/16/17 1102    Clinical Impression Statement  Re-assessment performed today and patient has met all short term and long term goals. Her cervical ROM is within functional limits in all planes and she denies pain/discomfort with movement. She has demonstrated safe lifting mechanics and had no "straining" or pain in her neck after 10 reps. Her NDI score improved by 14 points demonstrating a significant improvement in function. She was educated on comprehensive HEP to facilitate self-mobilization of thoracic spine and postural strengthening. [place] reported feeling confident in her ability to continue progressing independently and agrees she is ready to discharge from therapy. She will be discharged after this session.    Rehab Potential  Good    PT Frequency  2x / week    PT Duration  4 weeks    PT Treatment/Interventions  ADLs/Self Care Home Management;Electrical Stimulation;Traction;Moist Heat;Cryotherapy;Functional mobility training;Therapeutic activities;Therapeutic exercise;Neuromuscular re-education;Patient/family education;Manual techniques;Passive range of motion;Dry needling    PT Next Visit Plan  Discharge this session    PT Home Exercise Plan  Eval: seated chin retractions; cervical flexion extension x 5; cervical lateral flexion each side x5;  cervical rotation x 5 each direction; 02/09/17 - deadlift with bicep curl; 02/16/17 - postural theraband strengthenining, thoracic extension for self mobilization    Consulted and Agree with Plan of Care  Patient       Patient will benefit from skilled therapeutic intervention in order to improve the following deficits and impairments:  Pain, Decreased mobility, Increased muscle spasms, Postural dysfunction, Improper body mechanics, Increased fascial restricitons, Decreased range of motion, Hypomobility, Impaired flexibility  Visit Diagnosis: Cervicalgia  Stiffness of neck  Muscle weakness (generalized)     Problem List Patient Active Problem List   Diagnosis Date Noted  . Migraine without aura and without status migrainosus, not intractable 08/14/2014  . Anxiety and depression 08/30/2012  . Migraines 08/02/2012    Kipp Brood, PT, DPT Physical Therapist with [facility]  02/16/2017 11:12 AM    [place], [place], [address] Phone: [phone]   Fax:  [phone]  Name: LILLION ELBERT MRN: [id number] Date of Birth: 01/01/1986

## 2017-04-08 ENCOUNTER — Other Ambulatory Visit: Payer: Self-pay | Admitting: Family Medicine

## 2017-04-29 ENCOUNTER — Other Ambulatory Visit: Payer: Managed Care, Other (non HMO)

## 2017-06-24 ENCOUNTER — Other Ambulatory Visit: Payer: Self-pay | Admitting: Family Medicine

## 2017-09-19 ENCOUNTER — Other Ambulatory Visit: Payer: Self-pay | Admitting: Family Medicine

## 2017-12-13 ENCOUNTER — Other Ambulatory Visit: Payer: Self-pay | Admitting: Family Medicine

## 2017-12-24 ENCOUNTER — Other Ambulatory Visit: Payer: Self-pay | Admitting: Family Medicine

## 2017-12-26 ENCOUNTER — Other Ambulatory Visit: Payer: Self-pay | Admitting: Family Medicine

## 2018-02-18 ENCOUNTER — Other Ambulatory Visit: Payer: Self-pay | Admitting: Family Medicine

## 2018-07-11 ENCOUNTER — Other Ambulatory Visit: Payer: Self-pay | Admitting: Family Medicine

## 2018-07-11 NOTE — Telephone Encounter (Signed)
Patient needs to schedule follow-up It may be virtual Then may have 1 refill each

## 2018-07-12 NOTE — Telephone Encounter (Signed)
LVM FOR PT TO CALL AND SCHEDULE VIRTUAL VISIT  °

## 2018-07-12 NOTE — Telephone Encounter (Signed)
Pt is scheduled for virtual 7/27

## 2018-07-17 ENCOUNTER — Other Ambulatory Visit: Payer: Self-pay | Admitting: Family Medicine

## 2018-07-17 NOTE — Telephone Encounter (Signed)
May have 1 refill needs virtual visit or in person visit

## 2018-07-27 ENCOUNTER — Other Ambulatory Visit: Payer: Self-pay

## 2018-07-31 ENCOUNTER — Other Ambulatory Visit: Payer: Self-pay

## 2018-07-31 ENCOUNTER — Encounter: Payer: Self-pay | Admitting: Family Medicine

## 2018-07-31 ENCOUNTER — Ambulatory Visit (INDEPENDENT_AMBULATORY_CARE_PROVIDER_SITE_OTHER): Payer: Managed Care, Other (non HMO) | Admitting: Family Medicine

## 2018-07-31 DIAGNOSIS — G4700 Insomnia, unspecified: Secondary | ICD-10-CM | POA: Diagnosis not present

## 2018-07-31 DIAGNOSIS — G43009 Migraine without aura, not intractable, without status migrainosus: Secondary | ICD-10-CM

## 2018-07-31 HISTORY — DX: Insomnia, unspecified: G47.00

## 2018-07-31 MED ORDER — RIZATRIPTAN BENZOATE 10 MG PO TBDP: ORAL_TABLET | ORAL | 12 refills | Status: DC

## 2018-07-31 MED ORDER — TIZANIDINE HCL 4 MG PO TABS: ORAL_TABLET | ORAL | 6 refills | Status: DC

## 2018-07-31 NOTE — Progress Notes (Signed)
   Subjective:    Patient ID: Kara Schwartz, female    DOB: 1985-08-30, 33 y.o.   MRN: [id number] Telephone only video not possible HPI Pt here today for refill on Maxalt. Pt states her headaches have been ok. As long as she has Maxalt to take, she is good. This patient has history of migraines.  States under pretty pretty good control with the Maxalt Does not want to take Topamax Denies any setbacks problems.  Denies abusing the medicine only takes caffeine in the morning with 1 cup of coffee  Pt has been having trouble going to sleep at night. Has been going on since February. Pt states that in the middle of the day she could take a nap but at night she gets a burst of energy.   Virtual Visit via Video Note  I connected with Kara Schwartz on 07/31/18 at  3:00 PM EDT by a video enabled telemedicine application and verified that I am speaking with the correct person using two identifiers.  Location: Patient: home Provider: office   I discussed the limitations of evaluation and management by telemedicine and the availability of in person appointments. The patient expressed understanding and agreed to proceed.  History of Present Illness:    Observations/Objective:   Assessment and Plan:   Follow Up Instructions:    I discussed the assessment and treatment plan with the patient. The patient was provided an opportunity to ask questions and all were answered. The patient agreed with the plan and demonstrated an understanding of the instructions.   The patient was advised to call back or seek an in-person evaluation if the symptoms worsen or if the condition fails to improve as anticipated. 15 I provided 15 minutes of non-face-to-face time during this encounter.   Vicente Males, LPN    Review of Systems  Constitutional: Negative for activity change and appetite change.  HENT: Negative for congestion and rhinorrhea.   Respiratory: Negative for cough and shortness of  breath.   Cardiovascular: Negative for chest pain and leg swelling.  Gastrointestinal: Negative for abdominal pain, nausea and vomiting.  Skin: Negative for color change.  Neurological: Positive for headaches. Negative for dizziness and weakness.  Psychiatric/Behavioral: Negative for agitation and confusion.   Insomnia also intermittent migraines     Objective:   Physical Exam Today's visit was via telephone Physical exam was not possible for this visit        Assessment & Plan:  Migraines unfortunately has poor 5/month but does not want to be on any type of daily migraine medicine will use Maxalt when necessary she will keep caffeine at a minimum  Insomnia we will send her some information regarding behavioral approach hold off on any medications

## 2018-11-29 ENCOUNTER — Other Ambulatory Visit: Payer: Self-pay

## 2018-11-29 ENCOUNTER — Other Ambulatory Visit (INDEPENDENT_AMBULATORY_CARE_PROVIDER_SITE_OTHER): Payer: Managed Care, Other (non HMO) | Admitting: *Deleted

## 2018-11-29 DIAGNOSIS — Z23 Encounter for immunization: Secondary | ICD-10-CM | POA: Diagnosis not present

## 2018-12-06 ENCOUNTER — Telehealth: Payer: Self-pay | Admitting: Family Medicine

## 2018-12-06 NOTE — Telephone Encounter (Signed)
Pt received letter from insurance on her rizatriptan (MAXALT-MLT) 10 MG disintegrating tablet.   Pt went to pharmacy to fill and was unable to get refilled due to needing a PA on medication.

## 2018-12-06 NOTE — Telephone Encounter (Signed)
Never received a pa from pharm so I called insurance and did it over the phone. Was told it had to go to case review. And could take up to 5 days. Case number [id number]. Pt was called and notified.

## 2018-12-14 NOTE — Telephone Encounter (Signed)
[name] at [facility] stated the medication does not require a prior authorization and that the patient tried to fill the medication too soon and that is why it would not go through. Pharmacy stated the medication now has went through and is ready for pick up. Left message to return call to notify patient.

## 2018-12-19 NOTE — Telephone Encounter (Signed)
Left message to return call 

## 2018-12-20 NOTE — Telephone Encounter (Signed)
Pt returned call and verbalized understanding  

## 2019-01-15 ENCOUNTER — Ambulatory Visit (INDEPENDENT_AMBULATORY_CARE_PROVIDER_SITE_OTHER): Payer: Managed Care, Other (non HMO) | Admitting: Family Medicine

## 2019-01-15 DIAGNOSIS — J019 Acute sinusitis, unspecified: Secondary | ICD-10-CM

## 2019-01-15 MED ORDER — AZITHROMYCIN 250 MG PO TABS: ORAL_TABLET | ORAL | 0 refills | Status: DC

## 2019-01-15 NOTE — Progress Notes (Signed)
   Subjective:    Patient ID: Kara Schwartz, female    DOB: April 19, 1985, 34 y.o.   MRN: [id number]  Cough This is a new problem. The current episode started 1 to 4 weeks ago (12 days ago ). The cough is productive of sputum. Associated symptoms include nasal congestion and rhinorrhea. Pertinent negatives include no chest pain, ear pain, fever, shortness of breath or wheezing. Treatments tried: Mucinex, Dayquil  The treatment provided mild relief.  Pt does not want to be tested for COVID. Patient does relate head congestion drainage coughing over the past 12 days denies high fever chills sweats wheezing difficulty breathing Virtual Visit via Telephone Note  I connected with Kara Schwartz on 01/15/19 at  3:00 PM EST by telephone and verified that I am speaking with the correct person using two identifiers.  Location: Patient: home Provider: office   I discussed the limitations, risks, security and privacy concerns of performing an evaluation and management service by telephone and the availability of in person appointments. I also discussed with the patient that there may be a patient responsible charge related to this service. The patient expressed understanding and agreed to proceed.   History of Present Illness:    Observations/Objective:   Assessment and Plan:   Follow Up Instructions:    I discussed the assessment and treatment plan with the patient. The patient was provided an opportunity to ask questions and all were answered. The patient agreed with the plan and demonstrated an understanding of the instructions.   The patient was advised to call back or seek an in-person evaluation if the symptoms worsen or if the condition fails to improve as anticipated.  I provided 16 minutes of non-face-to-face time during this encounter.        Review of Systems  Constitutional: Negative for activity change and fever.  HENT: Positive for congestion and rhinorrhea. Negative  for ear pain.   Eyes: Negative for discharge.  Respiratory: Positive for cough. Negative for shortness of breath and wheezing.   Cardiovascular: Negative for chest pain.       Objective:   Physical Exam  Today's visit was via telephone Physical exam was not possible for this visit       Assessment & Plan:  Acute rhinosinusitis go ahead with antibiotics Warning signs discussed I would recommend Covid testing but the patient does not want to do Covid testing currently Patient was told to call back if worse

## 2019-01-30 ENCOUNTER — Other Ambulatory Visit: Payer: Self-pay

## 2019-01-30 ENCOUNTER — Ambulatory Visit (HOSPITAL_COMMUNITY)
Admission: RE | Admit: 2019-01-30 | Discharge: 2019-01-30 | Disposition: A | Discharge status: Home / Self Care | Payer: Managed Care, Other (non HMO) | Source: Ambulatory Visit | Attending: Family Medicine | Admitting: Family Medicine

## 2019-01-30 ENCOUNTER — Ambulatory Visit (INDEPENDENT_AMBULATORY_CARE_PROVIDER_SITE_OTHER): Payer: Managed Care, Other (non HMO) | Admitting: Family Medicine

## 2019-01-30 DIAGNOSIS — R0789 Other chest pain: Secondary | ICD-10-CM | POA: Insufficient documentation

## 2019-01-30 DIAGNOSIS — R05 Cough: Secondary | ICD-10-CM

## 2019-01-30 DIAGNOSIS — R059 Cough, unspecified: Secondary | ICD-10-CM

## 2019-01-30 MED ORDER — DICLOFENAC SODIUM 75 MG PO TBEC: DELAYED_RELEASE_TABLET | ORAL | 0 refills | Status: DC

## 2019-01-30 MED ORDER — CEFPROZIL 500 MG PO TABS: 500.0000 mg | ORAL_TABLET | Freq: Two times a day (BID) | ORAL | 0 refills | Status: DC

## 2019-01-30 NOTE — Progress Notes (Signed)
   Subjective:    Patient ID: Kara Schwartz, female    DOB: 02-23-1985, 34 y.o.   MRN: [id number]  HPI patient having some head congestion drainage coughing chest congestion.  She was evaluated back on 11 January.  At that time it was recommended for her to do Covid testing but she did not want to do that.  She now has some intermittent chest pain and discomfort on the right side when she takes deep breath when she coughs she states she feels slightly short of breath but not terribly denies hemoptysis Review of Systems  Constitutional: Negative for activity change and fever.  HENT: Positive for congestion and rhinorrhea. Negative for ear pain.   Eyes: Negative for discharge.  Respiratory: Positive for cough. Negative for shortness of breath and wheezing.   Cardiovascular: Negative for chest pain.       Objective:   Physical Exam Vitals and nursing note reviewed.  Constitutional:      Appearance: She is well-developed.  HENT:     Head: Normocephalic.     Nose: Nose normal.     Mouth/Throat:     Pharynx: No oropharyngeal exudate.  Cardiovascular:     Rate and Rhythm: Normal rate.     Heart sounds: Normal heart sounds. No murmur.  Pulmonary:     Effort: Pulmonary effort is normal.     Breath sounds: Normal breath sounds. No wheezing.  Musculoskeletal:     Cervical back: Neck supple.  Lymphadenopathy:     Cervical: No cervical adenopathy.  Skin:    General: Skin is warm and dry.    No respiratory distress O2 saturation 96% lungs clear no crackles Patient describes chest soreness on the right mid chest region      Assessment & Plan:  Persistent respiratory illness Probable bronchitis Possible persistent sinusitis Covid testing reasonable but patient does not want to do this Chest x-ray ordered No pneumonia on x-ray Should get better on antibiotics if ongoing troubles follow-up

## 2019-02-15 ENCOUNTER — Encounter: Payer: Self-pay | Admitting: Family Medicine

## 2019-02-16 ENCOUNTER — Other Ambulatory Visit: Payer: Self-pay | Admitting: *Deleted

## 2019-02-16 MED ORDER — AMOXICILLIN-POT CLAVULANATE 875-125 MG PO TABS: 1.0000 | ORAL_TABLET | Freq: Two times a day (BID) | ORAL | 0 refills | Status: DC

## 2019-02-16 NOTE — Telephone Encounter (Signed)
Nurses Please connect with patient There is no doubt that what ever illness first hit her was more than just the average cold virus.  And I believe that that in turn set her up for a significant sinus infection.  Plus the underlying damage from the original virus can sometimes take weeks to repair itself and over that span of time a person can have a large amount of mucus such as what she is experienced  So therefore I do believe it is reasonable to do 1 additional round of antibiotics for any secondary bacterial infection Augmentin 875 mg is a combination of amoxicillin clavulanate that makes it into a strong medicine I recommend Augmentin 875 mg with food 1 twice daily for 10 days if she is dramatically better in 7 days she can stop the medicine  If she has significant decline in how she is doing I would recommend an office visit Thanks-Dr. Lorin Picket

## 2019-03-11 ENCOUNTER — Encounter: Payer: Self-pay | Admitting: Family Medicine

## 2019-08-09 ENCOUNTER — Other Ambulatory Visit: Payer: Self-pay | Admitting: Family Medicine

## 2019-10-23 ENCOUNTER — Encounter: Payer: Self-pay | Admitting: Family Medicine

## 2020-02-14 ENCOUNTER — Encounter: Payer: Self-pay | Admitting: Family Medicine

## 2020-02-14 DIAGNOSIS — Z1329 Encounter for screening for other suspected endocrine disorder: Secondary | ICD-10-CM

## 2020-02-14 DIAGNOSIS — Z13220 Encounter for screening for lipoid disorders: Secondary | ICD-10-CM

## 2020-02-14 DIAGNOSIS — R635 Abnormal weight gain: Secondary | ICD-10-CM

## 2020-02-14 NOTE — Addendum Note (Signed)
Addended by: Marlowe Shores on: 02/14/2020 09:08 AM   Modules accepted: Orders

## 2020-02-14 NOTE — Telephone Encounter (Signed)
Nurses May order lipid, TSH, free T4, metabolic 7 Screening, weight gain  We would also recommend an office visit in regards to these issues as well to be done somewhere in the next 4 to 6 weeks

## 2020-02-14 NOTE — Telephone Encounter (Signed)
No recent labs in epic

## 2020-02-19 ENCOUNTER — Telehealth: Payer: Self-pay | Admitting: Family Medicine

## 2020-02-19 NOTE — Telephone Encounter (Signed)
PA for Maxalt approved from 02/19/20-02/18/21 and pt is aware.

## 2020-02-28 DIAGNOSIS — R635 Abnormal weight gain: Secondary | ICD-10-CM | POA: Diagnosis not present

## 2020-02-28 DIAGNOSIS — Z1329 Encounter for screening for other suspected endocrine disorder: Secondary | ICD-10-CM | POA: Diagnosis not present

## 2020-02-28 DIAGNOSIS — Z13220 Encounter for screening for lipoid disorders: Secondary | ICD-10-CM | POA: Diagnosis not present

## 2020-02-29 LAB — BASIC METABOLIC PANEL
BUN/Creatinine Ratio: 13 (ref 9–23)
BUN: 11 mg/dL (ref 6–20)
CO2: 23 mmol/L (ref 20–29)
Calcium: 9.6 mg/dL (ref 8.7–10.2)
Chloride: 100 mmol/L (ref 96–106)
Creatinine, Ser: 0.83 mg/dL (ref 0.57–1.00)
GFR calc Af Amer: 106 mL/min/{1.73_m2} (ref >59)
GFR calc non Af Amer: 92 mL/min/{1.73_m2} (ref >59)
Glucose: 107 mg/dL — ABNORMAL HIGH (ref 65–99)
Potassium: 4.2 mmol/L (ref 3.5–5.2)
Sodium: 140 mmol/L (ref 134–144)

## 2020-02-29 LAB — LIPID PANEL
Chol/HDL Ratio: 3.7 ratio (ref 0.0–4.4)
Cholesterol, Total: 245 mg/dL — ABNORMAL HIGH (ref 100–199)
HDL: 67 mg/dL (ref >39)
LDL Chol Calc (NIH): 138 mg/dL — ABNORMAL HIGH (ref 0–99)
Triglycerides: 228 mg/dL — ABNORMAL HIGH (ref 0–149)
VLDL Cholesterol Cal: 40 mg/dL (ref 5–40)

## 2020-02-29 LAB — T4, FREE: Free T4: 1.43 ng/dL (ref 0.82–1.77)

## 2020-02-29 LAB — TSH: TSH: 1.72 u[IU]/mL (ref 0.450–4.500)

## 2020-03-01 ENCOUNTER — Other Ambulatory Visit: Payer: Self-pay | Admitting: Family Medicine

## 2020-03-18 ENCOUNTER — Encounter: Payer: Self-pay | Admitting: Family Medicine

## 2020-03-18 DIAGNOSIS — R635 Abnormal weight gain: Secondary | ICD-10-CM

## 2020-03-18 DIAGNOSIS — Z13220 Encounter for screening for lipoid disorders: Secondary | ICD-10-CM

## 2020-03-18 NOTE — Addendum Note (Signed)
Addended by: Marlowe Shores on: 03/18/2020 01:43 PM   Modules accepted: Orders

## 2020-03-18 NOTE — Telephone Encounter (Signed)
Nurses Please order lipid, glucose Patient can do these fasting in approximately 3 months Please forward the message to [place] Thanks-Dr. Lorin Picket   [facility] [place] Typically with healthy eating on a regular basis 1 can see significant improvement within 3 to 4 months.  It would be reasonable to repeat your cholesterol profile at your convenience in June.  The lab work has been ordered you can go at your convenience to [facility]  It should be noted that improved diet and regular activity can help cholesterol but genetics does play a role in our cholesterol readings.  As for plasma donation I am not familiar with where to go for that I assume that there is a location in [place].  That is something you may need to Google  [facility]-Dr. Lorin Picket

## 2020-03-23 ENCOUNTER — Encounter: Payer: Self-pay | Admitting: Family Medicine

## 2020-03-24 ENCOUNTER — Other Ambulatory Visit: Payer: Self-pay | Admitting: *Deleted

## 2020-03-24 MED ORDER — RIZATRIPTAN BENZOATE 10 MG PO TBDP: ORAL_TABLET | ORAL | 5 refills | Status: DC

## 2020-03-24 NOTE — Telephone Encounter (Signed)
Please send in 6 refills  and send patient notification

## 2020-06-19 DIAGNOSIS — Z6826 Body mass index (BMI) 26.0-26.9, adult: Secondary | ICD-10-CM | POA: Diagnosis not present

## 2020-06-19 DIAGNOSIS — Z01419 Encounter for gynecological examination (general) (routine) without abnormal findings: Secondary | ICD-10-CM | POA: Diagnosis not present

## 2020-06-20 ENCOUNTER — Other Ambulatory Visit: Payer: Self-pay | Admitting: Obstetrics and Gynecology

## 2020-06-20 DIAGNOSIS — Z1239 Encounter for other screening for malignant neoplasm of breast: Secondary | ICD-10-CM

## 2020-06-24 ENCOUNTER — Other Ambulatory Visit: Payer: Self-pay | Admitting: Obstetrics and Gynecology

## 2020-07-02 ENCOUNTER — Other Ambulatory Visit: Payer: Self-pay | Admitting: Obstetrics and Gynecology

## 2020-07-02 DIAGNOSIS — N644 Mastodynia: Secondary | ICD-10-CM

## 2020-07-17 ENCOUNTER — Other Ambulatory Visit: Payer: Self-pay | Admitting: Obstetrics and Gynecology

## 2020-07-17 DIAGNOSIS — N644 Mastodynia: Secondary | ICD-10-CM

## 2020-07-17 DIAGNOSIS — N63 Unspecified lump in unspecified breast: Secondary | ICD-10-CM

## 2020-07-18 DIAGNOSIS — R635 Abnormal weight gain: Secondary | ICD-10-CM | POA: Diagnosis not present

## 2020-07-18 DIAGNOSIS — Z13220 Encounter for screening for lipoid disorders: Secondary | ICD-10-CM | POA: Diagnosis not present

## 2020-07-19 LAB — LIPID PANEL
Chol/HDL Ratio: 3.9 ratio (ref 0.0–4.4)
Cholesterol, Total: 240 mg/dL — ABNORMAL HIGH (ref 100–199)
HDL: 62 mg/dL (ref >39)
LDL Chol Calc (NIH): 143 mg/dL — ABNORMAL HIGH (ref 0–99)
Triglycerides: 194 mg/dL — ABNORMAL HIGH (ref 0–149)
VLDL Cholesterol Cal: 35 mg/dL (ref 5–40)

## 2020-07-19 LAB — GLUCOSE, RANDOM: Glucose: 97 mg/dL (ref 65–99)

## 2020-07-22 ENCOUNTER — Other Ambulatory Visit: Payer: Self-pay

## 2020-07-22 ENCOUNTER — Ambulatory Visit
Admission: RE | Admit: 2020-07-22 | Discharge: 2020-07-22 | Disposition: A | Discharge status: Home / Self Care | Payer: Managed Care, Other (non HMO) | Source: Ambulatory Visit | Attending: Obstetrics and Gynecology | Admitting: Obstetrics and Gynecology

## 2020-07-22 ENCOUNTER — Ambulatory Visit: Payer: Managed Care, Other (non HMO)

## 2020-07-22 ENCOUNTER — Ambulatory Visit
Admission: RE | Admit: 2020-07-22 | Discharge: 2020-07-22 | Disposition: A | Discharge status: Home / Self Care | Payer: BC Managed Care – PPO | Source: Ambulatory Visit | Attending: Obstetrics and Gynecology | Admitting: Obstetrics and Gynecology

## 2020-07-22 DIAGNOSIS — N63 Unspecified lump in unspecified breast: Secondary | ICD-10-CM

## 2020-07-22 DIAGNOSIS — N644 Mastodynia: Secondary | ICD-10-CM

## 2020-07-22 DIAGNOSIS — R922 Inconclusive mammogram: Secondary | ICD-10-CM | POA: Diagnosis not present

## 2020-07-28 ENCOUNTER — Encounter: Payer: Self-pay | Admitting: Family Medicine

## 2020-07-28 ENCOUNTER — Other Ambulatory Visit: Payer: Self-pay

## 2020-07-28 ENCOUNTER — Ambulatory Visit (INDEPENDENT_AMBULATORY_CARE_PROVIDER_SITE_OTHER): Payer: BC Managed Care – PPO | Admitting: Family Medicine

## 2020-07-28 VITALS — BP 138/92 | Temp 97.7°F | Ht 65.0 in | Wt 159.6 lb

## 2020-07-28 DIAGNOSIS — R609 Edema, unspecified: Secondary | ICD-10-CM

## 2020-07-28 DIAGNOSIS — R030 Elevated blood-pressure reading, without diagnosis of hypertension: Secondary | ICD-10-CM

## 2020-07-28 DIAGNOSIS — F411 Generalized anxiety disorder: Secondary | ICD-10-CM | POA: Diagnosis not present

## 2020-07-28 MED ORDER — ESCITALOPRAM OXALATE 10 MG PO TABS: 10.0000 mg | ORAL_TABLET | Freq: Every day | ORAL | 2 refills | Status: DC

## 2020-07-28 MED ORDER — CHLORTHALIDONE 25 MG PO TABS: ORAL_TABLET | ORAL | 2 refills | Status: DC

## 2020-07-28 MED ORDER — POTASSIUM CHLORIDE CRYS ER 10 MEQ PO TBCR: EXTENDED_RELEASE_TABLET | ORAL | 2 refills | Status: DC

## 2020-07-28 NOTE — Progress Notes (Signed)
   Subjective:    Patient ID: Kara Schwartz, female    DOB: [birth year], 35 y.o.   MRN: [id number]  HPI Pt here to discuss labs. Pt had labs done recently. Did see results via my chart but would like provider to go over them in detail.  Blood pressure was elevated at GYN and they advised to keep an eye on BP.  Good discussion with the patient regarding her blood pressure She is trying to eat relatively healthy She does admit to taking over-the-counter weight control medication Hydroxycut She does try to be careful with salt She does walking on treadmill anywhere from 2 times a week all the way up to 4 times a week Review of Systems     Objective:   Physical Exam  General-in no acute distress Eyes-no discharge Lungs-respiratory rate normal, CTA CV-no murmurs,RRR Extremities skin warm dry no edema Neuro grossly normal Behavior normal, alert       Assessment & Plan:  1. Elevated blood pressure reading Blood pressure borderline.  Very important for patient to adhere to a healthy diet.  I would recommend trying to stay away from the Hydroxycut.  Also recommend for the patient to check her blood pressure several times at home and send [place] updates may need to be on medications  2. Peripheral edema Intermittent peripheral edema in the hands and feet after long discussion with the patient shared decision to start chlorthalidone low-dose as needed basis take potassium with it when necessary  Also discussed hyperlipidemia no need to start statin currently glucose looks good healthy diet regular activity weight control best approach repeat lipid profile on a yearly basis  3. GAD (generalized anxiety disorder) Patient at times feeling overwhelmed anxious difficult time focusing worrying about a lot of things feeling stressed out.  We did discuss how benzodiazepines are not appropriate We also discussed how trying other measures such as self-help counseling can be beneficial patient would  like to try medication we discussed serotonin reuptake inhibitors have decided on low-dose Lexapro patient will give [place] feedback in 2 to 3 weeks follow-up in 4 to 6 weeks  Patient not suicidal denies being depressed Patient was told nausea could occur with medicine but if any other problems with the medicine immediately let us know

## 2020-07-28 NOTE — Patient Instructions (Signed)
https://www.nhlbi.nih.gov/files/docs/public/heart/dash_brief.pdf">  DASH Eating Plan DASH stands for Dietary Approaches to Stop Hypertension. The DASH eating plan is a healthy eating plan that has been shown to: Reduce high blood pressure (hypertension). Reduce your risk for type 2 diabetes, heart disease, and stroke. Help with weight loss. What are tips for following this plan? Reading food labels Check food labels for the amount of salt (sodium) per serving. Choose foods with less than 5 percent of the Daily Value of sodium. Generally, foods with less than 300 milligrams (mg) of sodium per serving fit into this eating plan. To find whole grains, look for the word "whole" as the first word in the ingredient list. Shopping Buy products labeled as "low-sodium" or "no salt added." Buy fresh foods. Avoid canned foods and pre-made or frozen meals. Cooking Avoid adding salt when cooking. Use salt-free seasonings or herbs instead of table salt or sea salt. Check with your health care provider or pharmacist before using salt substitutes. Do not fry foods. Cook foods using healthy methods such as baking, boiling, grilling, roasting, and broiling instead. Cook with heart-healthy oils, such as olive, canola, avocado, soybean, or sunflower oil. Meal planning  Eat a balanced diet that includes: 4 or more servings of fruits and 4 or more servings of vegetables each day. Try to fill one-half of your plate with fruits and vegetables. 6-8 servings of whole grains each day. Less than 6 oz (170 g) of lean meat, poultry, or fish each day. A 3-oz (85-g) serving of meat is about the same size as a deck of cards. One egg equals 1 oz (28 g). 2-3 servings of low-fat dairy each day. One serving is 1 cup (237 mL). 1 serving of nuts, seeds, or beans 5 times each week. 2-3 servings of heart-healthy fats. Healthy fats called omega-3 fatty acids are found in foods such as walnuts, flaxseeds, fortified milks, and eggs.  These fats are also found in cold-water fish, such as sardines, salmon, and mackerel. Limit how much you eat of: Canned or prepackaged foods. Food that is high in trans fat, such as some fried foods. Food that is high in saturated fat, such as fatty meat. Desserts and other sweets, sugary drinks, and other foods with added sugar. Full-fat dairy products. Do not salt foods before eating. Do not eat more than 4 egg yolks a week. Try to eat at least 2 vegetarian meals a week. Eat more home-cooked food and less restaurant, buffet, and fast food.  Lifestyle When eating at a restaurant, ask that your food be prepared with less salt or no salt, if possible. If you drink alcohol: Limit how much you use to: 0-1 drink a day for women who are not pregnant. 0-2 drinks a day for men. Be aware of how much alcohol is in your drink. In the [place] one drink equals one 12 oz bottle of beer (355 mL), one 5 oz glass of wine (148 mL), or one 1 oz glass of hard liquor (44 mL). General information Avoid eating more than 2,300 mg of salt a day. If you have hypertension, you may need to reduce your sodium intake to 1,500 mg a day. Work with your health care provider to maintain a healthy body weight or to lose weight. Ask what an ideal weight is for you. Get at least 30 minutes of exercise that causes your heart to beat faster (aerobic exercise) most days of the week. Activities may include walking, swimming, or biking. Work with your health care provider   or dietitian to adjust your eating plan to your individual calorie needs. What foods should I eat? Fruits All fresh, dried, or frozen fruit. Canned fruit in natural juice (without addedsugar). Vegetables Fresh or frozen vegetables (raw, steamed, roasted, or grilled). Low-sodium or reduced-sodium tomato and vegetable juice. Low-sodium or reduced-sodium tomatosauce and tomato paste. Low-sodium or reduced-sodium canned vegetables. Grains Whole-grain or  whole-wheat bread. Whole-grain or whole-wheat pasta. Brown rice. Oatmeal. Quinoa. Bulgur. Whole-grain and low-sodium cereals. Pita bread.Low-fat, low-sodium crackers. Whole-wheat flour tortillas. Meats and other proteins Skinless chicken or turkey. Ground chicken or turkey. Pork with fat trimmed off. Fish and seafood. Egg whites. Dried beans, peas, or lentils. Unsalted nuts, nut butters, and seeds. Unsalted canned beans. Lean cuts of beef with fat trimmed off. Low-sodium, lean precooked or cured meat, such as sausages or meatloaves. Dairy Low-fat (1%) or fat-free (skim) milk. Reduced-fat, low-fat, or fat-free cheeses. Nonfat, low-sodium ricotta or cottage cheese. Low-fat or nonfatyogurt. Low-fat, low-sodium cheese. Fats and oils Soft margarine without trans fats. Vegetable oil. Reduced-fat, low-fat, or light mayonnaise and salad dressings (reduced-sodium). Canola, safflower, olive, avocado, soybean, andsunflower oils. Avocado. Seasonings and condiments Herbs. Spices. Seasoning mixes without salt. Other foods Unsalted popcorn and pretzels. Fat-free sweets. The items listed above may not be a complete list of foods and beverages you can eat. Contact a dietitian for more information. What foods should I avoid? Fruits Canned fruit in a light or heavy syrup. Fried fruit. Fruit in cream or buttersauce. Vegetables Creamed or fried vegetables. Vegetables in a cheese sauce. Regular canned vegetables (not low-sodium or reduced-sodium). Regular canned tomato sauce and paste (not low-sodium or reduced-sodium). Regular tomato and vegetable juice(not low-sodium or reduced-sodium). Pickles. Olives. Grains Baked goods made with fat, such as croissants, muffins, or some breads. Drypasta or rice meal packs. Meats and other proteins Fatty cuts of meat. Ribs. Fried meat. Bacon. Bologna, salami, and other precooked or cured meats, such as sausages or meat loaves. Fat from the back of a pig (fatback). Bratwurst.  Salted nuts and seeds. Canned beans with added salt. Canned orsmoked fish. Whole eggs or egg yolks. Chicken or turkey with skin. Dairy Whole or 2% milk, cream, and half-and-half. Whole or full-fat cream cheese. Whole-fat or sweetened yogurt. Full-fat cheese. Nondairy creamers. Whippedtoppings. Processed cheese and cheese spreads. Fats and oils Butter. Stick margarine. Lard. Shortening. Ghee. Bacon fat. Tropical oils, suchas coconut, palm kernel, or palm oil. Seasonings and condiments Onion salt, garlic salt, seasoned salt, table salt, and sea salt. Worcestershire sauce. Tartar sauce. Barbecue sauce. Teriyaki sauce. Soy sauce, including reduced-sodium. Steak sauce. Canned and packaged gravies. Fish sauce. Oyster sauce. Cocktail sauce. Store-bought horseradish. Ketchup. Mustard. Meat flavorings and tenderizers. Bouillon cubes. Hot sauces. Pre-made or packaged marinades. Pre-made or packaged taco seasonings. Relishes. Regular saladdressings. Other foods Salted popcorn and pretzels. The items listed above may not be a complete list of foods and beverages you should avoid. Contact a dietitian for more information. Where to find more information National Heart, Lung, and Blood Institute: www.nhlbi.nih.gov American Heart Association: www.heart.org Academy of Nutrition and Dietetics: www.eatright.org National Kidney Foundation: www.kidney.org Summary The DASH eating plan is a healthy eating plan that has been shown to reduce high blood pressure (hypertension). It may also reduce your risk for type 2 diabetes, heart disease, and stroke. When on the DASH eating plan, aim to eat more fresh fruits and vegetables, whole grains, lean proteins, low-fat dairy, and heart-healthy fats. With the DASH eating plan, you should limit salt (sodium) intake to 2,300   mg a day. If you have hypertension, you may need to reduce your sodium intake to 1,500 mg a day. Work with your health care provider or dietitian to adjust  your eating plan to your individual calorie needs. This information is not intended to replace advice given to you by your health care provider. Make sure you discuss any questions you have with your healthcare provider. Document Revised: 11/24/2018 Document Reviewed: 11/24/2018 Elsevier Patient Education  2022 [employer] Inc.  

## 2020-09-09 ENCOUNTER — Ambulatory Visit: Payer: BC Managed Care – PPO | Admitting: Family Medicine

## 2020-09-09 ENCOUNTER — Encounter: Payer: Self-pay | Admitting: Family Medicine

## 2020-09-09 ENCOUNTER — Other Ambulatory Visit: Payer: Self-pay

## 2020-09-09 VITALS — BP 128/78 | HR 72 | Ht 65.0 in | Wt 157.0 lb

## 2020-09-09 DIAGNOSIS — R030 Elevated blood-pressure reading, without diagnosis of hypertension: Secondary | ICD-10-CM

## 2020-09-09 DIAGNOSIS — Z79899 Other long term (current) drug therapy: Secondary | ICD-10-CM

## 2020-09-09 DIAGNOSIS — Z1159 Encounter for screening for other viral diseases: Secondary | ICD-10-CM | POA: Diagnosis not present

## 2020-09-09 DIAGNOSIS — R600 Localized edema: Secondary | ICD-10-CM | POA: Diagnosis not present

## 2020-09-09 MED ORDER — POTASSIUM CHLORIDE CRYS ER 10 MEQ PO TBCR: EXTENDED_RELEASE_TABLET | ORAL | 5 refills | Status: DC

## 2020-09-09 MED ORDER — CHLORTHALIDONE 25 MG PO TABS: ORAL_TABLET | ORAL | 5 refills | Status: DC

## 2020-09-09 MED ORDER — ESCITALOPRAM OXALATE 20 MG PO TABS: 20.0000 mg | ORAL_TABLET | Freq: Every day | ORAL | 5 refills | Status: DC

## 2020-09-09 NOTE — Patient Instructions (Addendum)
Send [place] your BP readings and send an update in 3 to 4 weeks  DASH Eating Plan DASH stands for Dietary Approaches to Stop Hypertension. The DASH eating plan is a healthy eating plan that has been shown to: Reduce high blood pressure (hypertension). Reduce your risk for type 2 diabetes, heart disease, and stroke. Help with weight loss. What are tips for following this plan? Reading food labels Check food labels for the amount of salt (sodium) per serving. Choose foods with less than 5 percent of the Daily Value of sodium. Generally, foods with less than 300 milligrams (mg) of sodium per serving fit into this eating plan. To find whole grains, look for the word "whole" as the first word in the ingredient list. Shopping Buy products labeled as "low-sodium" or "no salt added." Buy fresh foods. Avoid canned foods and pre-made or frozen meals. Cooking Avoid adding salt when cooking. Use salt-free seasonings or herbs instead of table salt or sea salt. Check with your health care provider or pharmacist before using salt substitutes. Do not fry foods. Cook foods using healthy methods such as baking, boiling, grilling, roasting, and broiling instead. Cook with heart-healthy oils, such as olive, canola, avocado, soybean, or sunflower oil. Meal planning  Eat a balanced diet that includes: 4 or more servings of fruits and 4 or more servings of vegetables each day. Try to fill one-half of your plate with fruits and vegetables. 6-8 servings of whole grains each day. Less than 6 oz (170 g) of lean meat, poultry, or fish each day. A 3-oz (85-g) serving of meat is about the same size as a deck of cards. One egg equals 1 oz (28 g). 2-3 servings of low-fat dairy each day. One serving is 1 cup (237 mL). 1 serving of nuts, seeds, or beans 5 times each week. 2-3 servings of heart-healthy fats. Healthy fats called omega-3 fatty acids are found in foods such as walnuts, flaxseeds, fortified milks, and eggs. These  fats are also found in cold-water fish, such as sardines, salmon, and mackerel. Limit how much you eat of: Canned or prepackaged foods. Food that is high in trans fat, such as some fried foods. Food that is high in saturated fat, such as fatty meat. Desserts and other sweets, sugary drinks, and other foods with added sugar. Full-fat dairy products. Do not salt foods before eating. Do not eat more than 4 egg yolks a week. Try to eat at least 2 vegetarian meals a week. Eat more home-cooked food and less restaurant, buffet, and fast food. Lifestyle When eating at a restaurant, ask that your food be prepared with less salt or no salt, if possible. If you drink alcohol: Limit how much you use to: 0-1 drink a day for women who are not pregnant. 0-2 drinks a day for men. Be aware of how much alcohol is in your drink. In the [place] one drink equals one 12 oz bottle of beer (355 mL), one 5 oz glass of wine (148 mL), or one 1 oz glass of hard liquor (44 mL). General information Avoid eating more than 2,300 mg of salt a day. If you have hypertension, you may need to reduce your sodium intake to 1,500 mg a day. Work with your health care provider to maintain a healthy body weight or to lose weight. Ask what an ideal weight is for you. Get at least 30 minutes of exercise that causes your heart to beat faster (aerobic exercise) most days of the week. Activities  may include walking, swimming, or biking. Work with your health care provider or dietitian to adjust your eating plan to your individual calorie needs. What foods should I eat? Fruits All fresh, dried, or frozen fruit. Canned fruit in natural juice (without added sugar). Vegetables Fresh or frozen vegetables (raw, steamed, roasted, or grilled). Low-sodium or reduced-sodium tomato and vegetable juice. Low-sodium or reduced-sodium tomato sauce and tomato paste. Low-sodium or reduced-sodium canned vegetables. Grains Whole-grain or whole-wheat  bread. Whole-grain or whole-wheat pasta. Brown rice. Orpah Cobb. Bulgur. Whole-grain and low-sodium cereals. Pita bread. Low-fat, low-sodium crackers. Whole-wheat flour tortillas. Meats and other proteins Skinless chicken or [place]. Ground chicken or [place]. Pork with fat trimmed off. Fish and seafood. Egg whites. Dried beans, peas, or lentils. Unsalted nuts, nut butters, and seeds. Unsalted canned beans. Lean cuts of beef with fat trimmed off. Low-sodium, lean precooked or cured meat, such as sausages or meat loaves. Dairy Low-fat (1%) or fat-free (skim) milk. Reduced-fat, low-fat, or fat-free cheeses. Nonfat, low-sodium ricotta or cottage cheese. Low-fat or nonfat yogurt. Low-fat, low-sodium cheese. Fats and oils Soft margarine without trans fats. Vegetable oil. Reduced-fat, low-fat, or light mayonnaise and salad dressings (reduced-sodium). Canola, safflower, olive, avocado, soybean, and sunflower oils. Avocado. Seasonings and condiments Herbs. Spices. Seasoning mixes without salt. Other foods Unsalted popcorn and pretzels. Fat-free sweets. The items listed above may not be a complete list of foods and beverages you can eat. Contact a dietitian for more information. What foods should I avoid? Fruits Canned fruit in a light or heavy syrup. Fried fruit. Fruit in cream or butter sauce. Vegetables Creamed or fried vegetables. Vegetables in a cheese sauce. Regular canned vegetables (not low-sodium or reduced-sodium). Regular canned tomato sauce and paste (not low-sodium or reduced-sodium). Regular tomato and vegetable juice (not low-sodium or reduced-sodium). Rosita Fire. Olives. Grains Baked goods made with fat, such as croissants, muffins, or some breads. Dry pasta or rice meal packs. Meats and other proteins Fatty cuts of meat. Ribs. Fried meat. Tomasa Blase. Bologna, salami, and other precooked or cured meats, such as sausages or meat loaves. Fat from the back of a pig (fatback). Bratwurst. Salted  nuts and seeds. Canned beans with added salt. Canned or smoked fish. Whole eggs or egg yolks. Chicken or [place] with skin. Dairy Whole or 2% milk, cream, and half-and-half. Whole or full-fat cream cheese. Whole-fat or sweetened yogurt. Full-fat cheese. Nondairy creamers. Whipped toppings. Processed cheese and cheese spreads. Fats and oils Butter. Stick margarine. Lard. Shortening. Ghee. Bacon fat. Tropical oils, such as coconut, palm kernel, or palm oil. Seasonings and condiments Onion salt, garlic salt, seasoned salt, table salt, and sea salt. Worcestershire sauce. Tartar sauce. Barbecue sauce. Teriyaki sauce. Soy sauce, including reduced-sodium. Steak sauce. Canned and packaged gravies. Fish sauce. Oyster sauce. Cocktail sauce. Store-bought horseradish. Ketchup. Mustard. Meat flavorings and tenderizers. Bouillon cubes. Hot sauces. Pre-made or packaged marinades. Pre-made or packaged taco seasonings. Relishes. Regular salad dressings. Other foods Salted popcorn and pretzels. The items listed above may not be a complete list of foods and beverages you should avoid. Contact a dietitian for more information. Where to find more information National Heart, Lung, and Blood Institute: PopSteam.is American Heart Association: www.heart.org Academy of Nutrition and Dietetics: www.eatright.org National Kidney Foundation: www.kidney.org Summary The DASH eating plan is a healthy eating plan that has been shown to reduce high blood pressure (hypertension). It may also reduce your risk for type 2 diabetes, heart disease, and stroke. When on the DASH eating plan, aim to eat more fresh  fruits and vegetables, whole grains, lean proteins, low-fat dairy, and heart-healthy fats. With the DASH eating plan, you should limit salt (sodium) intake to 2,300 mg a day. If you have hypertension, you may need to reduce your sodium intake to 1,500 mg a day. Work with your health care provider or dietitian to adjust your  eating plan to your individual calorie needs. This information is not intended to replace advice given to you by your health care provider. Make sure you discuss any questions you have with your health care provider. Document Revised: 11/24/2018 Document Reviewed: 11/24/2018 Elsevier Patient Education  2022 [employer].

## 2020-09-09 NOTE — Progress Notes (Signed)
   Subjective:    Patient ID: Kara Schwartz, female    DOB: 1985-07-22, 35 y.o.   MRN: [id number]  HPI Patient arrives for a follow up on elevated blood pressure and anxiety. Patient with some intermittent headaches.  May be 3/month.  She does not want to be on any type of daily medication She does not feel its bad enough to warrant any additional treatments  She states her moods are doing better with the Lexapro but she would like to try higher dose to see if it will help more she denies any setbacks with it.  States it is helping with anxiousness denies being depressed  The low-dose chlorthalidone 25 mg tablet she is taking a half a tablet a day with potassium she states it seems to be doing a good job with her swelling  Review of Systems     Objective:   Physical Exam General-in no acute distress Eyes-no discharge Lungs-respiratory rate normal, CTA CV-no murmurs,RRR Extremities skin warm dry no edema Neuro grossly normal Behavior normal, alert         Assessment & Plan:   1. High risk medication use Because of her diuretic use check metabolic 7 - Basic metabolic panel  2. Encounter for hepatitis C screening test for low risk patient Screening - Hepatitis C Antibody  3. Pedal edema Doing much better.  Taking half a tablet daily with the potassium check metabolic 7  4. Transient elevated blood pressure Blood pressure reading under good control continue current measures  Patient states that she has stopped taking her Hydroxycut but states she did use a few doses over the past month to help give her energy.  I encouraged her to drink extra cup of coffee instead and to stay away from Hydroxycut  Follow-up within 6 months

## 2020-09-10 LAB — HEPATITIS C ANTIBODY: Hep C Virus Ab: <0.1 s/co ratio (ref 0.0–0.9)

## 2020-09-10 LAB — BASIC METABOLIC PANEL
BUN/Creatinine Ratio: 12 (ref 9–23)
BUN: 11 mg/dL (ref 6–20)
CO2: 26 mmol/L (ref 20–29)
Calcium: 10.2 mg/dL (ref 8.7–10.2)
Chloride: 98 mmol/L (ref 96–106)
Creatinine, Ser: 0.91 mg/dL (ref 0.57–1.00)
Glucose: 85 mg/dL (ref 65–99)
Potassium: 3.8 mmol/L (ref 3.5–5.2)
Sodium: 139 mmol/L (ref 134–144)
eGFR: 84 mL/min/{1.73_m2} (ref >59)

## 2020-09-10 MED ORDER — HYDROCHLOROTHIAZIDE 12.5 MG PO CAPS: ORAL_CAPSULE | ORAL | 3 refills | Status: DC

## 2020-09-10 NOTE — Addendum Note (Signed)
Addended by: Marlowe Shores on: 09/10/2020 03:54 PM   Modules accepted: Orders

## 2020-09-10 NOTE — Telephone Encounter (Signed)
Nurses I would recommend switching her diuretic Stop chlorthalidone Recommend hydrochlorothiazide 12.5 mg She may take 1 of those every morning as needed for any swelling #30 with 3 refills Continue to take the potassium when she takes a diuretic   Hydrochlorothiazide would have less effect on her blood pressure in regards to causing any type of low pressures Plus also would have less effect on her potassium   I would like for the patient to follow her blood pressures and please send [place] an update in a few weeks time and follow-up with him 4 months

## 2020-10-20 ENCOUNTER — Other Ambulatory Visit: Payer: Self-pay | Admitting: Family Medicine

## 2020-10-20 MED ORDER — RIZATRIPTAN BENZOATE 10 MG PO TBDP: ORAL_TABLET | ORAL | 5 refills | Status: DC

## 2020-10-21 ENCOUNTER — Telehealth: Payer: Self-pay | Admitting: Family Medicine

## 2020-10-21 ENCOUNTER — Encounter: Payer: Self-pay | Admitting: Family Medicine

## 2020-10-21 NOTE — Telephone Encounter (Signed)
Pt needing PA done for medication. Pt contacted and made aware that we will begin to work on PA as soon as possible. Pt verbalized understanding.

## 2020-10-21 NOTE — Telephone Encounter (Signed)
Patient stated that the rizatriptan that was called in for her yesterday cannot be filled due to her insurance. She asked if there was something Dr. Lorin Picket could do so her insurance would approve to fill. Please advise.  [employer]  CB#  [phone]

## 2020-10-22 NOTE — Telephone Encounter (Signed)
Pt scheduled for Friday 10/24/20 with Eber Jones

## 2020-10-23 ENCOUNTER — Encounter: Payer: Self-pay | Admitting: Family Medicine

## 2020-10-24 ENCOUNTER — Ambulatory Visit: Payer: BC Managed Care – PPO | Admitting: Nurse Practitioner

## 2020-10-24 ENCOUNTER — Encounter: Payer: Self-pay | Admitting: Nurse Practitioner

## 2020-10-24 ENCOUNTER — Other Ambulatory Visit: Payer: Self-pay

## 2020-10-24 VITALS — BP 134/81 | HR 80 | Ht 65.0 in | Wt 159.4 lb

## 2020-10-24 DIAGNOSIS — R1032 Left lower quadrant pain: Secondary | ICD-10-CM

## 2020-10-24 NOTE — Patient Instructions (Signed)
Take Align or Activia yogurt daily  High-Fiber Eating Plan Fiber, also called dietary fiber, is a type of carbohydrate. It is found foods such as fruits, vegetables, whole grains, and beans. A high-fiber diet can have many health benefits. Your health care provider may recommend a high-fiber diet to help: Prevent constipation. Fiber can make your bowel movements more regular. Lower your cholesterol. Relieve the following conditions: Inflammation of veins in the anus (hemorrhoids). Inflammation of specific areas of the digestive tract (uncomplicated diverticulosis). A problem of the large intestine, also called the colon, that sometimes causes pain and diarrhea (irritable bowel syndrome, or IBS). Prevent overeating as part of a weight-loss plan. Prevent heart disease, type 2 diabetes, and certain cancers. What are tips for following this plan? Reading food labels  Check the nutrition facts label on food products for the amount of dietary fiber. Choose foods that have 5 grams of fiber or more per serving. The goals for recommended daily fiber intake include: Men (age 52 or younger): 34-38 g. Men (over age 16): 28-34 g. Women (age 77 or younger): 25-28 g. Women (over age 27): 22-25 g. Your daily fiber goal is _____________ g. Shopping Choose whole fruits and vegetables instead of processed forms, such as apple juice or applesauce. Choose a wide variety of high-fiber foods such as avocados, lentils, oats, and kidney beans. Read the nutrition facts label of the foods you choose. Be aware of foods with added fiber. These foods often have high sugar and sodium amounts per serving. Cooking Use whole-grain flour for baking and cooking. Cook with brown rice instead of white rice. Meal planning Start the day with a breakfast that is high in fiber, such as a cereal that contains 5 g of fiber or more per serving. Eat breads and cereals that are made with whole-grain flour instead of refined flour  or white flour. Eat brown rice, bulgur wheat, or millet instead of white rice. Use beans in place of meat in soups, salads, and pasta dishes. Be sure that half of the grains you eat each day are whole grains. General information You can get the recommended daily intake of dietary fiber by: Eating a variety of fruits, vegetables, grains, nuts, and beans. Taking a fiber supplement if you are not able to take in enough fiber in your diet. It is better to get fiber through food than from a supplement. Gradually increase how much fiber you consume. If you increase your intake of dietary fiber too quickly, you may have bloating, cramping, or gas. Drink plenty of water to help you digest fiber. Choose high-fiber snacks, such as berries, raw vegetables, nuts, and popcorn. What foods should I eat? Fruits Berries. Pears. Apples. Oranges. Avocado. Prunes and raisins. Dried figs. Vegetables Sweet potatoes. Spinach. Kale. Artichokes. Cabbage. Broccoli. Cauliflower. Green peas. Carrots. Squash. Grains Whole-grain breads. Multigrain cereal. Oats and oatmeal. Brown rice. Barley. Bulgur wheat. Millet. Quinoa. Bran muffins. Popcorn. Rye wafer crackers. Meats and other proteins Navy beans, kidney beans, and pinto beans. Soybeans. Split peas. Lentils. Nuts and seeds. Dairy Fiber-fortified yogurt. Beverages Fiber-fortified soy milk. Fiber-fortified orange juice. Other foods Fiber bars. The items listed above may not be a complete list of recommended foods and beverages. Contact a dietitian for more information. What foods should I avoid? Fruits Fruit juice. Cooked, strained fruit. Vegetables Fried potatoes. Canned vegetables. Well-cooked vegetables. Grains White bread. Pasta made with refined flour. White rice. Meats and other proteins Fatty cuts of meat. Fried chicken or fried fish. Dairy Milk.  Yogurt. Cream cheese. Sour cream. Fats and oils Butters. Beverages Soft drinks. Other foods Cakes  and pastries. The items listed above may not be a complete list of foods and beverages to avoid. Talk with your dietitian about what choices are best for you. Summary Fiber is a type of carbohydrate. It is found in foods such as fruits, vegetables, whole grains, and beans. A high-fiber diet has many benefits. It can help to prevent constipation, lower blood cholesterol, aid weight loss, and reduce your risk of heart disease, diabetes, and certain cancers. Increase your intake of fiber gradually. Increasing fiber too quickly may cause cramping, bloating, and gas. Drink plenty of water while you increase the amount of fiber you consume. The best sources of fiber include whole fruits and vegetables, whole grains, nuts, seeds, and beans. This information is not intended to replace advice given to you by your health care provider. Make sure you discuss any questions you have with your health care provider. Document Revised: 04/26/2019 Document Reviewed: 04/26/2019 Elsevier Patient Education  2022 [employer].

## 2020-10-24 NOTE — Progress Notes (Signed)
   Subjective:    Patient ID: Kara Schwartz, female    DOB: [birth year], 35 y.o.   MRN: [id number]  HPI  Patient with LLQ abdominal pain-started last Thursday- feels like you have to have BM and gassy but intense- has had the spells before but normally don't last long. Feeling some better today. States this is the fourth episode she has had over the past several months, this is the worst, most intense episode.  Was seen by her gynecologist recently, states that exam was normal.  She was told that it was most likely her bowels and recommended a probiotic.  Has not identified any specific triggers.  States that walking helps some.  Describes a cramping sensation in the left lower quadrant that comes and goes.  The worst day was 3 days ago, has improved.  No diarrhea or constipation.  No change in the color of her stools.  No fevers.  No back pain.  No urinary symptoms.  Has noticed very loud bowel sounds.      Objective:   Physical Exam NAD.  Alert, oriented.  Lungs clear.  Heart regular rate rhythm.  Abdomen mildly distended with very loud hyperactive bowel sounds x4.  Moderate tenderness localized to the left lower quadrant, no rebound or guarding.  No obvious masses.  Abdominal exam otherwise benign. Today's Vitals   10/24/20 1032  BP: 134/81  Pulse: 80  Weight: 159 lb 6.4 oz (72.3 kg)  Height: 5\' 5"  (1.651 m)   Body mass index is 26.53 kg/m. Weight has been stable since July.       Assessment & Plan:  Left lower quadrant abdominal pain  There are no urgent symptoms indicating diverticulitis that needs treatment.  We will hold on any further testing at this time.  Recommend high-fiber diet and probiotic daily.  Also recommend the patient examine her diet to see if anything new has been added over the past few months that might be triggering her symptoms. Warning signs reviewed including fever chills diarrhea and worsening abdominal pain.  Patient to call or go to ED if any problems.   Recommend follow-up within the next couple weeks if no improvement.

## 2020-10-25 ENCOUNTER — Encounter: Payer: Self-pay | Admitting: Nurse Practitioner

## 2020-10-27 NOTE — Telephone Encounter (Signed)
Tanya-is this medication denied no matter what the form?  Or just as a dissolvable tablet?

## 2020-10-28 ENCOUNTER — Other Ambulatory Visit: Payer: Self-pay | Admitting: Family Medicine

## 2020-10-28 MED ORDER — RIZATRIPTAN BENZOATE 10 MG PO TABS: 10.0000 mg | ORAL_TABLET | ORAL | 5 refills | Status: DC | PRN

## 2020-10-28 NOTE — Telephone Encounter (Signed)
Tablet form was sent in, if for some reason still not covered please let us know

## 2020-12-09 ENCOUNTER — Other Ambulatory Visit: Payer: Self-pay | Admitting: Family Medicine

## 2021-03-09 ENCOUNTER — Other Ambulatory Visit: Payer: Self-pay

## 2021-03-09 ENCOUNTER — Ambulatory Visit: Payer: BC Managed Care – PPO | Admitting: Family Medicine

## 2021-03-09 VITALS — BP 126/88 | HR 72 | Temp 98.1°F | Ht 65.0 in | Wt 174.8 lb

## 2021-03-09 DIAGNOSIS — R600 Localized edema: Secondary | ICD-10-CM

## 2021-03-09 DIAGNOSIS — E785 Hyperlipidemia, unspecified: Secondary | ICD-10-CM

## 2021-03-09 DIAGNOSIS — G43009 Migraine without aura, not intractable, without status migrainosus: Secondary | ICD-10-CM | POA: Diagnosis not present

## 2021-03-09 DIAGNOSIS — Z13220 Encounter for screening for lipoid disorders: Secondary | ICD-10-CM | POA: Diagnosis not present

## 2021-03-09 MED ORDER — ESCITALOPRAM OXALATE 20 MG PO TABS: 20.0000 mg | ORAL_TABLET | Freq: Every day | ORAL | 1 refills | Status: DC

## 2021-03-09 MED ORDER — HYDROCHLOROTHIAZIDE 12.5 MG PO CAPS: ORAL_CAPSULE | ORAL | 1 refills | Status: DC

## 2021-03-09 MED ORDER — POTASSIUM CHLORIDE CRYS ER 10 MEQ PO TBCR: EXTENDED_RELEASE_TABLET | ORAL | 1 refills | Status: DC

## 2021-03-09 MED ORDER — RIZATRIPTAN BENZOATE 10 MG PO TABS: 10.0000 mg | ORAL_TABLET | ORAL | 12 refills | Status: DC | PRN

## 2021-03-09 NOTE — Progress Notes (Signed)
? ?  Subjective:  ? ? Patient ID: Kara Schwartz, female    DOB: 02/18/1985, 36 y.o.   MRN: [id number] ? ?HPI ? ?Patient here for follow up on medications.  Needs refill on Rizatriptan.  Having issues when goes to pick up medication.  ?Has frequent migraines she thinks she gets about 3 a week.  In the past she tried topiramate but it caused her to feel angry.  She did not tolerate it well. ?She is taking her Lexapro regular basis states her moods are doing well ?She does use the hydrochlorothiazide on a as needed basis potassium as needed basis ?Her lab work is due ?Has history of hyperlipidemia ?She is trying to eat healthy ?She needs to try to fit in walking for regular exercise ?Review of Systems ? ?   ?Objective:  ? Physical Exam ?General-in no acute distress ?Eyes-no discharge ?Lungs-respiratory rate normal, CTA ?CV-no murmurs,RRR ?Extremities skin warm dry no edema ?Neuro grossly normal ?Behavior normal, alert ? ? ? ? ?   ?Assessment & Plan:  ?1. Pedal edema ?Use HCTZ on a as needed basis.  Take potassium with it ?- Basic Metabolic Panel (BMET) ?- Lipid Profile ? ?2. Hyperlipidemia, unspecified hyperlipidemia type ?Healthy diet regular activity recheck lipid ?- Basic Metabolic Panel (BMET) ?- Lipid Profile ? ?3. Migraine without aura and without status migrainosus, not intractable ?Patient states she will find the medicine that she saw on TV she will send me a MyChart message on whether or not it would be beneficial for her ? ?4. Screening, lipid ?Screening ?- Lipid Profile ? ?Follow-up 6 months ?Maxalt refill sent in patient was told there may be quantity limitations by her pharmacy/insurance company ?

## 2021-03-09 NOTE — Patient Instructions (Signed)
DASH Eating Plan °DASH stands for Dietary Approaches to Stop Hypertension. The DASH eating plan is a healthy eating plan that has been shown to: °Reduce high blood pressure (hypertension). °Reduce your risk for type 2 diabetes, heart disease, and stroke. °Help with weight loss. °What are tips for following this plan? °Reading food labels °Check food labels for the amount of salt (sodium) per serving. Choose foods with less than 5 percent of the Daily Value of sodium. Generally, foods with less than 300 milligrams (mg) of sodium per serving fit into this eating plan. °To find whole grains, look for the word "whole" as the first word in the ingredient list. °Shopping °Buy products labeled as "low-sodium" or "no salt added." °Buy fresh foods. Avoid canned foods and pre-made or frozen meals. °Cooking °Avoid adding salt when cooking. Use salt-free seasonings or herbs instead of table salt or sea salt. Check with your health care provider or pharmacist before using salt substitutes. °Do not fry foods. Cook foods using healthy methods such as baking, boiling, grilling, roasting, and broiling instead. °Cook with heart-healthy oils, such as olive, canola, avocado, soybean, or sunflower oil. °Meal planning ° °Eat a balanced diet that includes: °4 or more servings of fruits and 4 or more servings of vegetables each day. Try to fill one-half of your plate with fruits and vegetables. °6-8 servings of whole grains each day. °Less than 6 oz (170 g) of lean meat, poultry, or fish each day. A 3-oz (85-g) serving of meat is about the same size as a deck of cards. One egg equals 1 oz (28 g). °2-3 servings of low-fat dairy each day. One serving is 1 cup (237 mL). °1 serving of nuts, seeds, or beans 5 times each week. °2-3 servings of heart-healthy fats. Healthy fats called omega-3 fatty acids are found in foods such as walnuts, flaxseeds, fortified milks, and eggs. These fats are also found in cold-water fish, such as sardines, salmon,  and mackerel. °Limit how much you eat of: °Canned or prepackaged foods. °Food that is high in trans fat, such as some fried foods. °Food that is high in saturated fat, such as fatty meat. °Desserts and other sweets, sugary drinks, and other foods with added sugar. °Full-fat dairy products. °Do not salt foods before eating. °Do not eat more than 4 egg yolks a week. °Try to eat at least 2 vegetarian meals a week. °Eat more home-cooked food and less restaurant, buffet, and fast food. °Lifestyle °When eating at a restaurant, ask that your food be prepared with less salt or no salt, if possible. °If you drink alcohol: °Limit how much you use to: °0-1 drink a day for women who are not pregnant. °0-2 drinks a day for men. °Be aware of how much alcohol is in your drink. In the [place] one drink equals one 12 oz bottle of beer (355 mL), one 5 oz glass of wine (148 mL), or one 1½ oz glass of hard liquor (44 mL). °General information °Avoid eating more than 2,300 mg of salt a day. If you have hypertension, you may need to reduce your sodium intake to 1,500 mg a day. °Work with your health care provider to maintain a healthy body weight or to lose weight. Ask what an ideal weight is for you. °Get at least 30 minutes of exercise that causes your heart to beat faster (aerobic exercise) most days of the week. Activities may include walking, swimming, or biking. °Work with your health care provider or dietitian to   adjust your eating plan to your individual calorie needs. °What foods should I eat? °Fruits °All fresh, dried, or frozen fruit. Canned fruit in natural juice (without added sugar). °Vegetables °Fresh or frozen vegetables (raw, steamed, roasted, or grilled). Low-sodium or reduced-sodium tomato and vegetable juice. Low-sodium or reduced-sodium tomato sauce and tomato paste. Low-sodium or reduced-sodium canned vegetables. °Grains °Whole-grain or whole-wheat bread. Whole-grain or whole-wheat pasta. Brown rice. Oatmeal. Quinoa.  Bulgur. Whole-grain and low-sodium cereals. Pita bread. Low-fat, low-sodium crackers. Whole-wheat flour tortillas. °Meats and other proteins °Skinless chicken or turkey. Ground chicken or turkey. Pork with fat trimmed off. Fish and seafood. Egg whites. Dried beans, peas, or lentils. Unsalted nuts, nut butters, and seeds. Unsalted canned beans. Lean cuts of beef with fat trimmed off. Low-sodium, lean precooked or cured meat, such as sausages or meat loaves. °Dairy °Low-fat (1%) or fat-free (skim) milk. Reduced-fat, low-fat, or fat-free cheeses. Nonfat, low-sodium ricotta or cottage cheese. Low-fat or nonfat yogurt. Low-fat, low-sodium cheese. °Fats and oils °Soft margarine without trans fats. Vegetable oil. Reduced-fat, low-fat, or light mayonnaise and salad dressings (reduced-sodium). Canola, safflower, olive, avocado, soybean, and sunflower oils. Avocado. °Seasonings and condiments °Herbs. Spices. Seasoning mixes without salt. °Other foods °Unsalted popcorn and pretzels. Fat-free sweets. °The items listed above may not be a complete list of foods and beverages you can eat. Contact a dietitian for more information. °What foods should I avoid? °Fruits °Canned fruit in a light or heavy syrup. Fried fruit. Fruit in cream or butter sauce. °Vegetables °Creamed or fried vegetables. Vegetables in a cheese sauce. Regular canned vegetables (not low-sodium or reduced-sodium). Regular canned tomato sauce and paste (not low-sodium or reduced-sodium). Regular tomato and vegetable juice (not low-sodium or reduced-sodium). Pickles. Olives. °Grains °Baked goods made with fat, such as croissants, muffins, or some breads. Dry pasta or rice meal packs. °Meats and other proteins °Fatty cuts of meat. Ribs. Fried meat. Bacon. Bologna, salami, and other precooked or cured meats, such as sausages or meat loaves. Fat from the back of a pig (fatback). Bratwurst. Salted nuts and seeds. Canned beans with added salt. Canned or smoked fish.  Whole eggs or egg yolks. Chicken or turkey with skin. °Dairy °Whole or 2% milk, cream, and half-and-half. Whole or full-fat cream cheese. Whole-fat or sweetened yogurt. Full-fat cheese. Nondairy creamers. Whipped toppings. Processed cheese and cheese spreads. °Fats and oils °Butter. Stick margarine. Lard. Shortening. Ghee. Bacon fat. Tropical oils, such as coconut, palm kernel, or palm oil. °Seasonings and condiments °Onion salt, garlic salt, seasoned salt, table salt, and sea salt. Worcestershire sauce. Tartar sauce. Barbecue sauce. Teriyaki sauce. Soy sauce, including reduced-sodium. Steak sauce. Canned and packaged gravies. Fish sauce. Oyster sauce. Cocktail sauce. Store-bought horseradish. Ketchup. Mustard. Meat flavorings and tenderizers. Bouillon cubes. Hot sauces. Pre-made or packaged marinades. Pre-made or packaged taco seasonings. Relishes. Regular salad dressings. °Other foods °Salted popcorn and pretzels. °The items listed above may not be a complete list of foods and beverages you should avoid. Contact a dietitian for more information. °Where to find more information °National Heart, Lung, and Blood Institute: www.nhlbi.nih.gov °American Heart Association: www.heart.org °Academy of Nutrition and Dietetics: www.eatright.org °National Kidney Foundation: www.kidney.org °Summary °The DASH eating plan is a healthy eating plan that has been shown to reduce high blood pressure (hypertension). It may also reduce your risk for type 2 diabetes, heart disease, and stroke. °When on the DASH eating plan, aim to eat more fresh fruits and vegetables, whole grains, lean proteins, low-fat dairy, and heart-healthy fats. °With the DASH   eating plan, you should limit salt (sodium) intake to 2,300 mg a day. If you have hypertension, you may need to reduce your sodium intake to 1,500 mg a day. °Work with your health care provider or dietitian to adjust your eating plan to your individual calorie needs. °This information is not  intended to replace advice given to you by your health care provider. Make sure you discuss any questions you have with your health care provider. °Document Revised: 11/24/2018 Document Reviewed: 11/24/2018 °Elsevier Patient Education © 2022 [employer]vier Inc. ° °

## 2021-04-10 ENCOUNTER — Encounter: Payer: Self-pay | Admitting: Family Medicine

## 2021-04-13 NOTE — Telephone Encounter (Signed)
Message sent to provider in telephone call  ?

## 2021-06-02 ENCOUNTER — Encounter: Payer: Self-pay | Admitting: Family Medicine

## 2021-06-24 DIAGNOSIS — Z3040 Encounter for surveillance of contraceptives, unspecified: Secondary | ICD-10-CM | POA: Diagnosis not present

## 2021-06-24 DIAGNOSIS — Z6829 Body mass index (BMI) 29.0-29.9, adult: Secondary | ICD-10-CM | POA: Diagnosis not present

## 2021-06-24 DIAGNOSIS — Z30432 Encounter for removal of intrauterine contraceptive device: Secondary | ICD-10-CM | POA: Diagnosis not present

## 2021-06-24 DIAGNOSIS — Z1151 Encounter for screening for human papillomavirus (HPV): Secondary | ICD-10-CM | POA: Diagnosis not present

## 2021-06-24 DIAGNOSIS — Z01419 Encounter for gynecological examination (general) (routine) without abnormal findings: Secondary | ICD-10-CM | POA: Diagnosis not present

## 2021-06-24 DIAGNOSIS — Z124 Encounter for screening for malignant neoplasm of cervix: Secondary | ICD-10-CM | POA: Diagnosis not present

## 2021-07-02 DIAGNOSIS — R600 Localized edema: Secondary | ICD-10-CM | POA: Diagnosis not present

## 2021-07-02 DIAGNOSIS — Z13220 Encounter for screening for lipoid disorders: Secondary | ICD-10-CM | POA: Diagnosis not present

## 2021-07-02 DIAGNOSIS — E785 Hyperlipidemia, unspecified: Secondary | ICD-10-CM | POA: Diagnosis not present

## 2021-07-03 LAB — BASIC METABOLIC PANEL
BUN/Creatinine Ratio: 13 (ref 9–23)
BUN: 11 mg/dL (ref 6–20)
CO2: 22 mmol/L (ref 20–29)
Calcium: 9.6 mg/dL (ref 8.7–10.2)
Chloride: 101 mmol/L (ref 96–106)
Creatinine, Ser: 0.83 mg/dL (ref 0.57–1.00)
Glucose: 95 mg/dL (ref 70–99)
Potassium: 4 mmol/L (ref 3.5–5.2)
Sodium: 144 mmol/L (ref 134–144)
eGFR: 94 mL/min/{1.73_m2} (ref >59)

## 2021-07-03 LAB — LIPID PANEL
Chol/HDL Ratio: 3.9 ratio (ref 0.0–4.4)
Cholesterol, Total: 255 mg/dL — ABNORMAL HIGH (ref 100–199)
HDL: 65 mg/dL (ref >39)
LDL Chol Calc (NIH): 149 mg/dL — ABNORMAL HIGH (ref 0–99)
Triglycerides: 225 mg/dL — ABNORMAL HIGH (ref 0–149)
VLDL Cholesterol Cal: 41 mg/dL — ABNORMAL HIGH (ref 5–40)

## 2021-09-09 ENCOUNTER — Ambulatory Visit: Payer: BC Managed Care – PPO | Admitting: Family Medicine

## 2021-09-15 ENCOUNTER — Other Ambulatory Visit: Payer: Self-pay | Admitting: *Deleted

## 2021-09-15 ENCOUNTER — Ambulatory Visit: Payer: BC Managed Care – PPO | Admitting: Family Medicine

## 2021-09-15 MED ORDER — ESCITALOPRAM OXALATE 20 MG PO TABS: 20.0000 mg | ORAL_TABLET | Freq: Every day | ORAL | 0 refills | Status: DC

## 2021-09-18 ENCOUNTER — Other Ambulatory Visit: Payer: Self-pay | Admitting: Family Medicine

## 2021-09-18 ENCOUNTER — Telehealth: Payer: Self-pay | Admitting: Family Medicine

## 2021-09-18 ENCOUNTER — Ambulatory Visit: Payer: BC Managed Care – PPO | Admitting: Family Medicine

## 2021-09-18 MED ORDER — HYDROCHLOROTHIAZIDE 12.5 MG PO CAPS: ORAL_CAPSULE | ORAL | 0 refills | Status: DC

## 2021-09-18 NOTE — Telephone Encounter (Signed)
Patient will not be able to come in for her appt today. She is rescheduled for Tuesday. She is requestng a refill on her fluid pill she isn't sure of the name. She uses [facility] on [address].

## 2021-09-18 NOTE — Telephone Encounter (Signed)
90-day given please keep follow-up visit thank you

## 2021-10-03 IMAGING — MG MM  DIGITAL DIAGNOSTIC BREAST BILAT IMPLANT W/ TOMO W/ CAD
8 of 14 series · 8 of 34 positions shown · non-contrast
Comparison: Previous exam(s).

CLINICAL DATA: 35-year-old female with diffuse LEFT breast pain.
Also for delayed follow-up of LEFT breast abnormalities identified
in 8792.

EXAM:
DIGITAL DIAGNOSTIC BILATERAL MAMMOGRAM WITH IMPLANTS, CAD AND
TOMOSYNTHESIS; ULTRASOUND LEFT BREAST LIMITED
TECHNIQUE: Bilateral digital diagnostic mammography and breast tomosynthesis
was performed. The images were evaluated with computer-aided
detection. Standard and/or implant displaced views were performed.;
Targeted ultrasound examination of the left breast was performed

[L MLO]
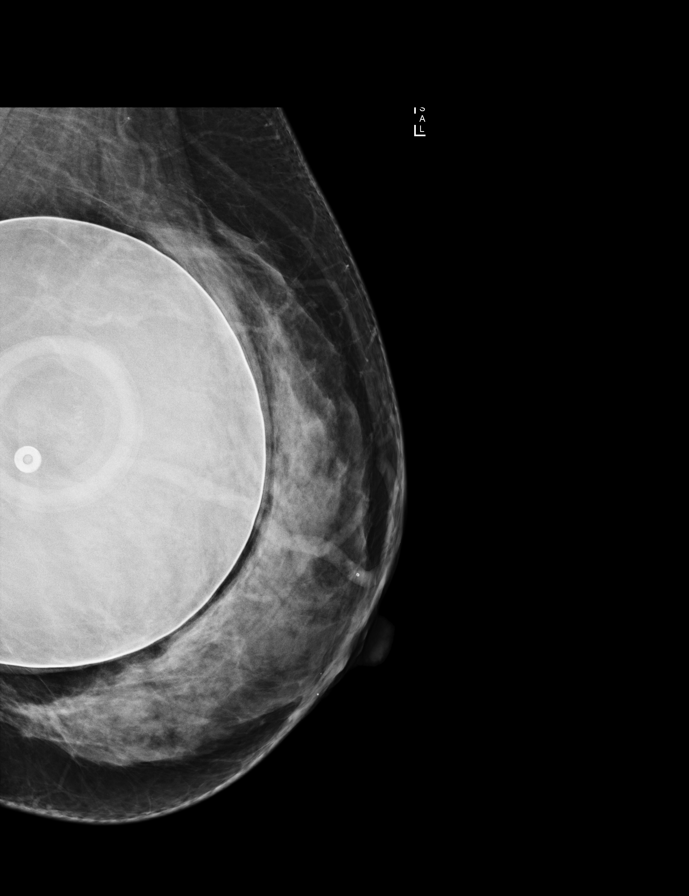

[L CC]
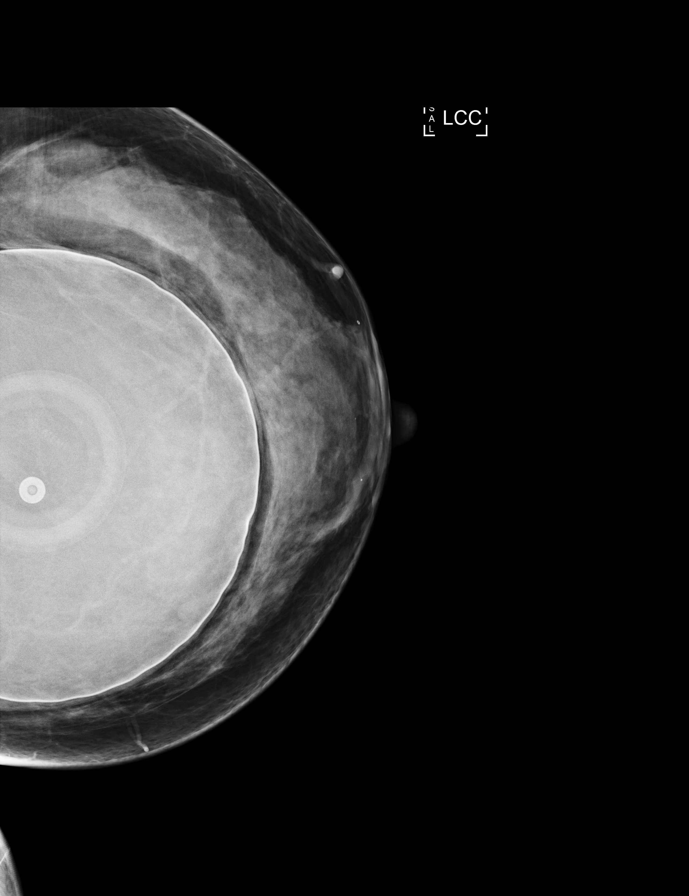

[R CC]
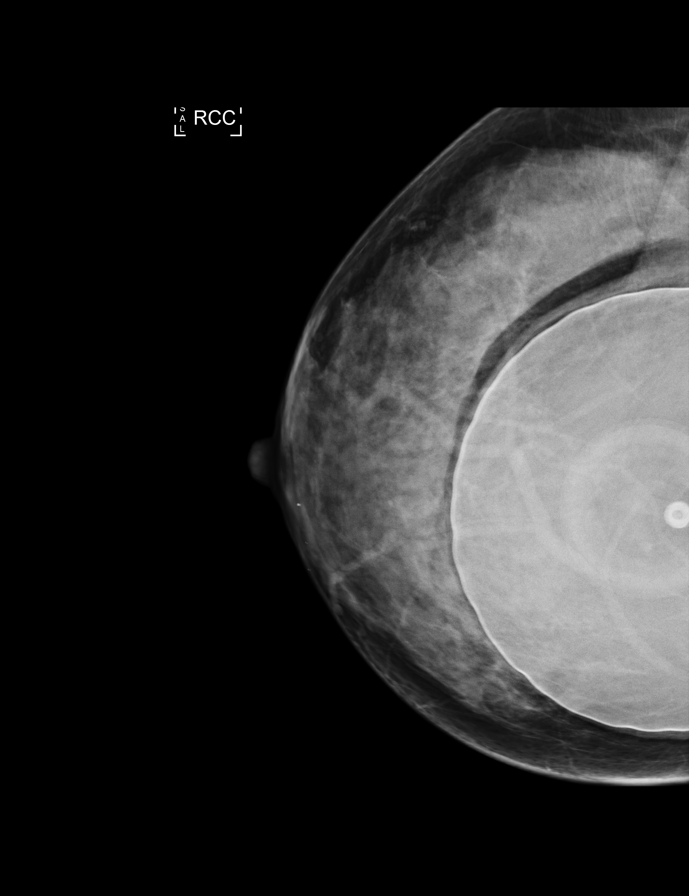

[R MLO]
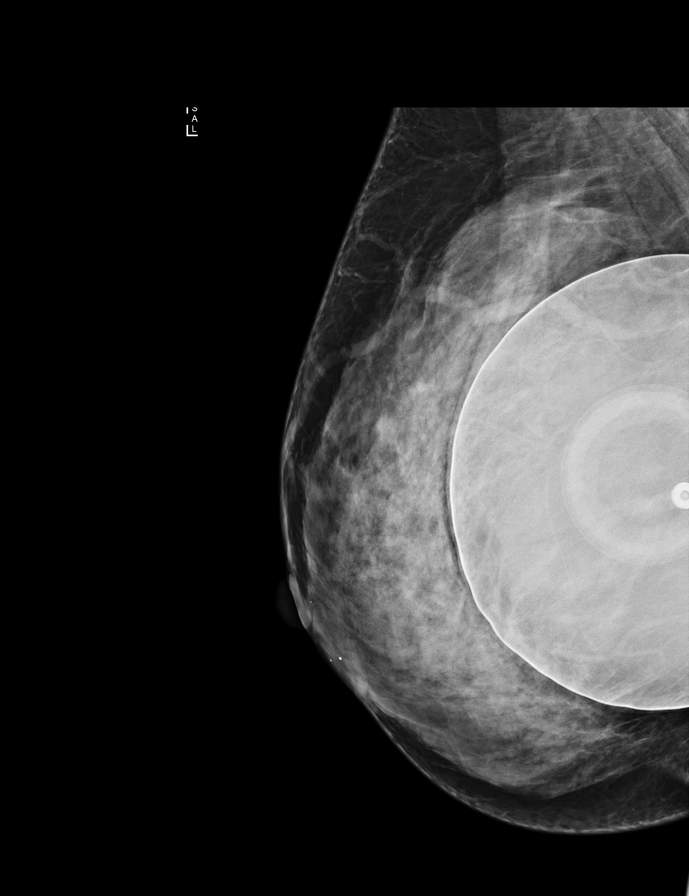

[R CC synth-2D]
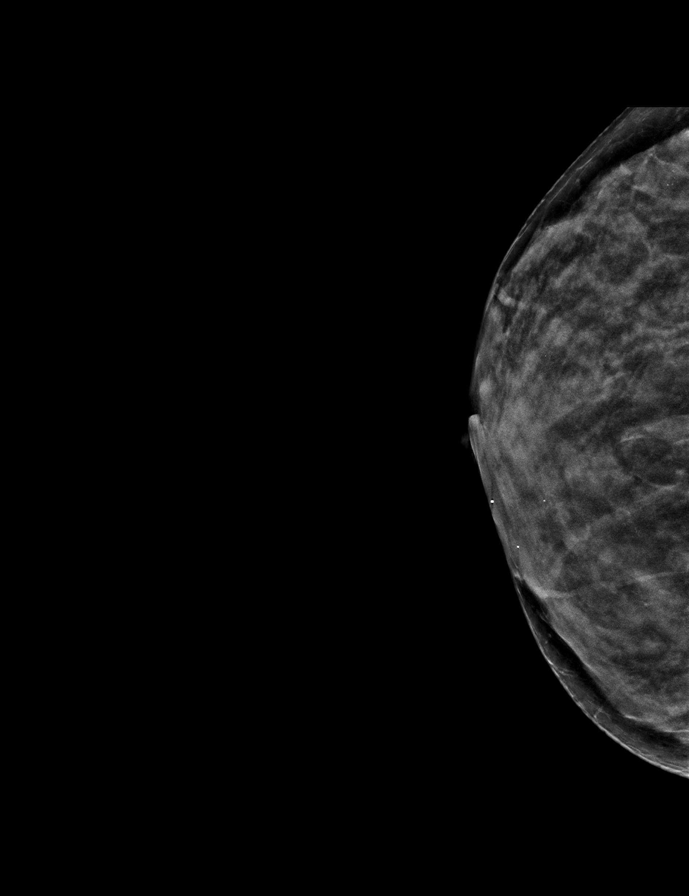

[L CC synth-2D]
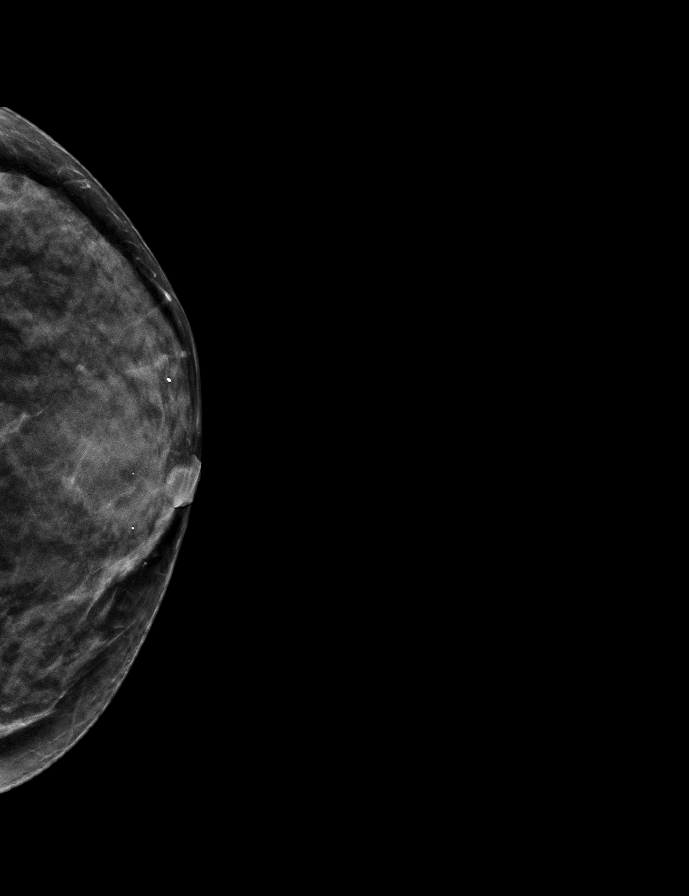

[R MLO synth-2D (1 of 2)]
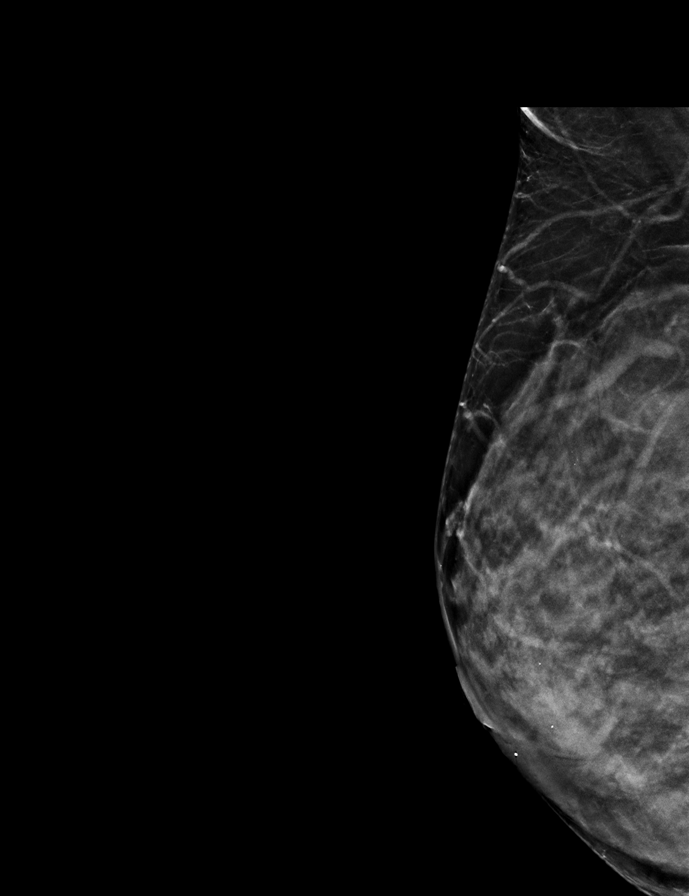

[R MLO synth-2D (2 of 2)]
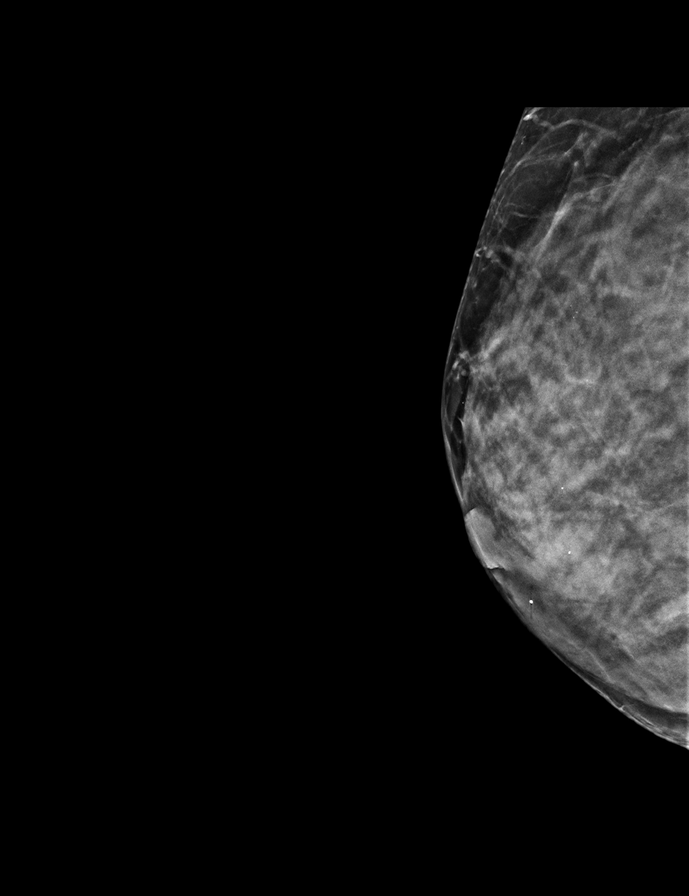

[8 of 34 positions shown; findings below may reference images not displayed]

ACR Breast Density Category d: The breast tissue is extremely dense,
which lowers the sensitivity of mammography.
FINDINGS: 2D/3D full field views of both breasts demonstrate no suspicious
mass, distortion or worrisome calcifications. The patient has
retropectoral implants.

Targeted ultrasound is performed, showing 2 benign cysts at the 7
o'clock position of the LEFT breast 4 cm from the nipple, measuring
1 cm and 0.8 cm in greatest diameter.

No persistent abnormality is identified at the [DATE] position of the
LEFT breast at the site of prior abnormality.
IMPRESSION: 1. No evidence of breast malignancy.
2. Benign cysts within the LOWER INNER LEFT breast.

RECOMMENDATION:
Bilateral screening mammogram at age 40.

I have discussed the findings, causes of breast pain and
recommendations with the patient. If applicable, a reminder letter
will be sent to the patient regarding the next appointment.

BI-RADS CATEGORY  2: Benign.

## 2021-10-20 ENCOUNTER — Encounter: Payer: Self-pay | Admitting: Family Medicine

## 2021-10-20 ENCOUNTER — Ambulatory Visit: Payer: BC Managed Care – PPO | Admitting: Family Medicine

## 2021-10-20 VITALS — BP 138/82 | HR 78 | Temp 98.1°F | Ht 65.0 in | Wt 174.0 lb

## 2021-10-20 DIAGNOSIS — G43009 Migraine without aura, not intractable, without status migrainosus: Secondary | ICD-10-CM | POA: Diagnosis not present

## 2021-10-20 DIAGNOSIS — R635 Abnormal weight gain: Secondary | ICD-10-CM | POA: Diagnosis not present

## 2021-10-20 DIAGNOSIS — F419 Anxiety disorder, unspecified: Secondary | ICD-10-CM

## 2021-10-20 DIAGNOSIS — F32A Depression, unspecified: Secondary | ICD-10-CM | POA: Diagnosis not present

## 2021-10-20 MED ORDER — POTASSIUM CHLORIDE CRYS ER 10 MEQ PO TBCR: EXTENDED_RELEASE_TABLET | ORAL | 1 refills | Status: DC

## 2021-10-20 MED ORDER — ESCITALOPRAM OXALATE 20 MG PO TABS: 20.0000 mg | ORAL_TABLET | Freq: Every day | ORAL | 1 refills | Status: DC

## 2021-10-20 MED ORDER — HYDROCHLOROTHIAZIDE 12.5 MG PO CAPS: ORAL_CAPSULE | ORAL | 0 refills | Status: DC

## 2021-10-20 MED ORDER — RIZATRIPTAN BENZOATE 10 MG PO TABS: 10.0000 mg | ORAL_TABLET | ORAL | 12 refills | Status: DC | PRN

## 2021-10-20 NOTE — Progress Notes (Signed)
   Subjective:    Patient ID: Kara Schwartz, female    DOB: 1985/12/15, 36 y.o.   MRN: [id number]  HPI Follow up for chronic medical conditions Concern of weight gain - requests  medication   Anxiety and depression  Migraine without aura and without status migrainosus, not intractable  Weight gain  We did have a long discussion today regarding how she is doing depression anxiety doing well She has some mild weight gain We did discuss family genetics of diabetes also discussed healthy eating regular activity staying away from sugary drinks Review of Systems     Objective:   Physical Exam General-in no acute distress Eyes-no discharge Lungs-respiratory rate normal, CTA CV-no murmurs,RRR Extremities skin warm dry no edema Neuro grossly normal Behavior normal, alert        Assessment & Plan:  1. Anxiety and depression She feels medicine is helping her continue medication currently  2. Migraine without aura and without status migrainosus, not intractable Brings under very good control  3. Weight gain Mild weight gain recently comprehensive lab work patient would like to do in the springtime Healthy diet regular physical activity recommended Portion control fitting and walking eliminating sugary drinks  Discussed with her the importance of healthy eating and regular physical activity to help bring her blood pressure down

## 2021-10-21 MED ORDER — CHLORZOXAZONE 500 MG PO TABS: ORAL_TABLET | ORAL | 1 refills | Status: DC

## 2021-10-21 NOTE — Telephone Encounter (Signed)
Nurses I would recommend stretching exercises for the back when it bothers her I would also recommend Parafon forte 1 taken twice daily as needed it is best to only use this medicine when at home not with driving, #32, 1 refill  As for the ankle discomfort more than likely this is arthralgia issues if its just intermittently I would recommend Tylenol or ibuprofen if it is frequently testing for inflammatory markers would come into play  It would be wise for the patient to keep track of how often these are occurring over the course of the next 30 days then let us know accordingly thank you

## 2022-03-23 ENCOUNTER — Other Ambulatory Visit: Payer: Self-pay | Admitting: Family Medicine

## 2022-03-24 ENCOUNTER — Other Ambulatory Visit: Payer: Self-pay | Admitting: Family Medicine

## 2022-03-24 MED ORDER — HYDROCHLOROTHIAZIDE 12.5 MG PO CAPS: ORAL_CAPSULE | ORAL | 0 refills | Status: DC

## 2022-04-01 DIAGNOSIS — D229 Melanocytic nevi, unspecified: Secondary | ICD-10-CM | POA: Diagnosis not present

## 2022-04-01 DIAGNOSIS — B078 Other viral warts: Secondary | ICD-10-CM | POA: Diagnosis not present

## 2022-04-01 DIAGNOSIS — L568 Other specified acute skin changes due to ultraviolet radiation: Secondary | ICD-10-CM | POA: Diagnosis not present

## 2022-04-01 DIAGNOSIS — L738 Other specified follicular disorders: Secondary | ICD-10-CM | POA: Diagnosis not present

## 2022-04-01 DIAGNOSIS — L821 Other seborrheic keratosis: Secondary | ICD-10-CM | POA: Diagnosis not present

## 2022-04-12 ENCOUNTER — Encounter: Payer: Self-pay | Admitting: Family Medicine

## 2022-04-12 DIAGNOSIS — N951 Menopausal and female climacteric states: Secondary | ICD-10-CM

## 2022-04-12 DIAGNOSIS — R600 Localized edema: Secondary | ICD-10-CM

## 2022-04-12 DIAGNOSIS — E785 Hyperlipidemia, unspecified: Secondary | ICD-10-CM

## 2022-04-12 DIAGNOSIS — Z79899 Other long term (current) drug therapy: Secondary | ICD-10-CM

## 2022-04-13 NOTE — Telephone Encounter (Signed)
Nurses Please go ahead with lipid profile, metabolic 7, urine ACR Diagnosis Diuretic use, hyperlipidemia, pedal edema  Please let patient know that there will be a blood test as well as urine test you may send her a MyChart message thanks we will see her at her follow-up visit coming up

## 2022-04-21 ENCOUNTER — Ambulatory Visit: Payer: BC Managed Care – PPO | Admitting: Family Medicine

## 2022-05-03 NOTE — Telephone Encounter (Signed)
Nurses may add TSH, LH FSH  Reason perimenopause, weight gain  Any further additions labs test etc. cannot be done until patient comes in for in person visit thank you

## 2022-05-04 NOTE — Addendum Note (Signed)
Addended by: Margaretha Sheffield on: 05/04/2022 10:47 AM   Modules accepted: Orders

## 2022-05-25 ENCOUNTER — Encounter: Payer: Self-pay | Admitting: Family Medicine

## 2022-05-25 DIAGNOSIS — N951 Menopausal and female climacteric states: Secondary | ICD-10-CM | POA: Diagnosis not present

## 2022-05-25 DIAGNOSIS — E785 Hyperlipidemia, unspecified: Secondary | ICD-10-CM | POA: Diagnosis not present

## 2022-05-25 DIAGNOSIS — R600 Localized edema: Secondary | ICD-10-CM | POA: Diagnosis not present

## 2022-05-25 DIAGNOSIS — Z79899 Other long term (current) drug therapy: Secondary | ICD-10-CM | POA: Diagnosis not present

## 2022-05-25 NOTE — Telephone Encounter (Signed)
Nurses-in this situation recommend stopping the hydrochlorothiazide to see if this really does help the itching  I would not recommend replacing anything without doing a follow-up visit so we can have a thorough discussion regarding situation, what type symptoms she is having, and medication options

## 2022-05-26 LAB — BASIC METABOLIC PANEL
BUN/Creatinine Ratio: 11 (ref 9–23)
BUN: 8 mg/dL (ref 6–20)
CO2: 25 mmol/L (ref 20–29)
Calcium: 9.8 mg/dL (ref 8.7–10.2)
Chloride: 96 mmol/L (ref 96–106)
Creatinine, Ser: 0.74 mg/dL (ref 0.57–1.00)
Glucose: 108 mg/dL — ABNORMAL HIGH (ref 70–99)
Potassium: 3.4 mmol/L — ABNORMAL LOW (ref 3.5–5.2)
Sodium: 137 mmol/L (ref 134–144)
eGFR: 107 mL/min/{1.73_m2} (ref >59)

## 2022-05-26 LAB — MICROALBUMIN / CREATININE URINE RATIO
Creatinine, Urine: 126.7 mg/dL
Microalb/Creat Ratio: 9 mg/g creat (ref 0–29)
Microalbumin, Urine: 11.2 ug/mL

## 2022-05-26 LAB — LIPID PANEL
Chol/HDL Ratio: 4.9 ratio — ABNORMAL HIGH (ref 0.0–4.4)
Cholesterol, Total: 306 mg/dL — ABNORMAL HIGH (ref 100–199)
HDL: 63 mg/dL (ref >39)
LDL Chol Calc (NIH): 183 mg/dL — ABNORMAL HIGH (ref 0–99)
Triglycerides: 310 mg/dL — ABNORMAL HIGH (ref 0–149)
VLDL Cholesterol Cal: 60 mg/dL — ABNORMAL HIGH (ref 5–40)

## 2022-05-26 LAB — FSH/LH
FSH: 3.9 m[IU]/mL
LH: 2.4 m[IU]/mL

## 2022-05-26 LAB — TSH: TSH: 2.82 u[IU]/mL (ref 0.450–4.500)

## 2022-05-27 ENCOUNTER — Telehealth: Payer: Self-pay

## 2022-05-27 NOTE — Telephone Encounter (Signed)
Prescription Request  05/27/2022  LOV: Visit date not found  What is the name of the medication or equipment?  Pt is not currently taking this med   Have you contacted your pharmacy to request a refill? Yes   Which pharmacy would you like this sent to?  [facility] [phone] - [place], [place] - [address] AT [facility] [address] [place] [address] Phone: [phone] Fax: [phone]    Patient notified that their request is being sent to the clinical staff for review and that they should receive a response within 2 business days.   Please advise at Mobile [phone] (mobile)

## 2022-05-27 NOTE — Telephone Encounter (Signed)
Disregard

## 2022-06-01 ENCOUNTER — Encounter: Payer: Self-pay | Admitting: Family Medicine

## 2022-06-01 ENCOUNTER — Ambulatory Visit: Payer: BC Managed Care – PPO | Admitting: Family Medicine

## 2022-06-01 VITALS — BP 124/84 | HR 77 | Ht 65.0 in | Wt 187.7 lb

## 2022-06-01 DIAGNOSIS — R7301 Impaired fasting glucose: Secondary | ICD-10-CM

## 2022-06-01 DIAGNOSIS — R635 Abnormal weight gain: Secondary | ICD-10-CM | POA: Diagnosis not present

## 2022-06-01 DIAGNOSIS — F419 Anxiety disorder, unspecified: Secondary | ICD-10-CM | POA: Diagnosis not present

## 2022-06-01 DIAGNOSIS — E785 Hyperlipidemia, unspecified: Secondary | ICD-10-CM

## 2022-06-01 DIAGNOSIS — F32A Depression, unspecified: Secondary | ICD-10-CM

## 2022-06-01 DIAGNOSIS — R600 Localized edema: Secondary | ICD-10-CM

## 2022-06-01 MED ORDER — ESCITALOPRAM OXALATE 20 MG PO TABS: 20.0000 mg | ORAL_TABLET | Freq: Every day | ORAL | 1 refills | Status: DC

## 2022-06-01 NOTE — Progress Notes (Addendum)
Subjective:    Patient ID: Kara Schwartz, female    DOB: [birth year], 37 y.o.   MRN: [id number]  HPI Patient arrives today for 6 month follow up.   Patient states after 20 mins of taking the hydrochlorothiazide a rash appears on neck and is itchy. Patient took benadryl to treat rash and is no longer taking the hydrochlorothiazide. 35 minutes spent with patient discussing multiple health issues She was concerned that she perimenopausal issues she does have some sweats but not severe she also at times feels hot She is concerned about her weight it has been going up over the past 2 years She does stay busy with activity but does not do any specific exercise She does try to eat healthier than she used to She also relates she still consumes sodas 1 or 2 every day Hyperlipidemia, unspecified hyperlipidemia type - Plan: Lipid panel  Weight gain  Fasting hyperglycemia - Plan: Glucose, Random, Hemoglobin A1c  Anxiety and depression  Pedal edema  She was taking HCTZ but is causing her neck to itch so she no longer takes it She does have a family history of diabetes She also states there is been other members and friends who had cancers makes her nervous and she wonders and what type of cancer screening she should have No personal or family history of breast or colon cancer  Results for orders placed or performed in visit on 04/12/22  Lipid panel  Result Value Ref Range   Cholesterol, Total 306 (H) 100 - 199 mg/dL   Triglycerides 564 (H) 0 - 149 mg/dL   HDL 63 >33 mg/dL   VLDL Cholesterol Cal 60 (H) 5 - 40 mg/dL   LDL Chol Calc (NIH) 295 (H) 0 - 99 mg/dL   Chol/HDL Ratio 4.9 (H) 0.0 - 4.4 ratio  Basic metabolic panel  Result Value Ref Range   Glucose 108 (H) 70 - 99 mg/dL   BUN 8 6 - 20 mg/dL   Creatinine, Ser 1.88 0.57 - 1.00 mg/dL   eGFR 416 >60 YT/KZS/0.10   BUN/Creatinine Ratio 11 9 - 23   Sodium 137 134 - 144 mmol/L   Potassium 3.4 (L) 3.5 - 5.2 mmol/L   Chloride 96 96 -  106 mmol/L   CO2 25 20 - 29 mmol/L   Calcium 9.8 8.7 - 10.2 mg/dL  Microalbumin / creatinine urine ratio  Result Value Ref Range   Creatinine, Urine 126.7 Not Estab. mg/dL   Microalbumin, Urine 93.2 Not Estab. ug/mL   Microalb/Creat Ratio 9 0 - 29 mg/g creat  TSH  Result Value Ref Range   TSH 2.820 0.450 - 4.500 uIU/mL  FSH/LH  Result Value Ref Range   LH 2.4 mIU/mL   FSH 3.9 mIU/mL      Review of Systems     Objective:   Physical Exam  General-in no acute distress Eyes-no discharge Lungs-respiratory rate normal, CTA CV-no murmurs,RRR Extremities skin warm dry no edema Neuro grossly normal Behavior normal, alert       Assessment & Plan:  1. Hyperlipidemia, unspecified hyperlipidemia type LDL 180+ very important for the patient to work hard on diet and exercise and healthy eating to try to get this down she desires not to be on medicine recheck lab work again in the fall time - Lipid panel We did discuss how at her current age she does not have to be on medication but as she gets older if her numbers stable at this point would  be recommended to be on medicine When she goes into her mid 76s if she still has this and does not want to be on medicine I would recommend coronary calcium test 2. Weight gain Significant weight gain could well be genetic but also at the same time dietary measures discussed in detail the importance of and fitting in brisk walking healthy eating more vegetables and fruit lean meats only cut back on salt use cut back on simple carbohydrates try to minimize soda use and eliminate soda possible GLP-1's would be advantageous for the patient but currently not covered 3. Fasting hyperglycemia Check A1c healthy diet see above - Glucose, Random - Hemoglobin A1c Given her family history developing diabetes as her weight goes up could happen healthy diet regular activity would be best 4. Anxiety and depression Doing well on medicine continue current  medication denies any setbacks  5. Pedal edema HCTZ because possible allergic reaction with itching around the neck when she was taking it intermittently.  Has not occurred since she has stopped taking it She is interested in monitoring for now  No sign of any edema in the legs currently recommend regular activity cutting back on salt staying away from additional medicine for now  Potassium slightly low on her blood work more than likely related to the diuretic use we will recheck this lab work again in several months  Labs in September with follow-up office visit

## 2022-06-01 NOTE — Patient Instructions (Signed)

## 2022-06-02 ENCOUNTER — Telehealth: Payer: Self-pay

## 2022-06-02 NOTE — Telephone Encounter (Signed)
Prescription Request  06/02/2022  LOV: Visit date not found  What is the name of the medication or equipment? chlorthalidone (HYGROTON) 25 MG tablet   Have you contacted your pharmacy to request a refill? Yes   Which pharmacy would you like this sent to?  [facility] [phone] - [place], [place] - [address] AT [facility] [address] [place] [address] Phone: [phone] Fax: [phone]    Patient notified that their request is being sent to the clinical staff for review and that they should receive a response within 2 business days.   Please advise at Mobile [phone] (mobile)

## 2022-06-02 NOTE — Telephone Encounter (Signed)
Auto refill fax sent by pharmacy - patient is no longer on this medication - disregard

## 2022-07-29 DIAGNOSIS — Z1151 Encounter for screening for human papillomavirus (HPV): Secondary | ICD-10-CM | POA: Diagnosis not present

## 2022-07-29 DIAGNOSIS — Z6831 Body mass index (BMI) 31.0-31.9, adult: Secondary | ICD-10-CM | POA: Diagnosis not present

## 2022-07-29 DIAGNOSIS — Z124 Encounter for screening for malignant neoplasm of cervix: Secondary | ICD-10-CM | POA: Diagnosis not present

## 2022-07-29 DIAGNOSIS — Z01419 Encounter for gynecological examination (general) (routine) without abnormal findings: Secondary | ICD-10-CM | POA: Diagnosis not present

## 2022-07-29 LAB — HM PAP SMEAR: HM Pap smear: NORMAL

## 2022-09-21 ENCOUNTER — Encounter: Payer: Self-pay | Admitting: Family Medicine

## 2022-09-21 ENCOUNTER — Other Ambulatory Visit: Payer: Self-pay | Admitting: Family Medicine

## 2022-09-21 NOTE — Telephone Encounter (Signed)
Nurses I would recommend Flexeril 5 mg 1 taken each evening near bedtime as necessary for muscle relaxation #30, 1 nightly as needed Please forward this message to the patient as well  Hi Kara Schwartz-please be aware that muscle relaxers do not specifically go to just 1 set of muscles to relax that muscle.  Instead they work in the brain to basically reduce signals to all muscles.  Most people have drowsiness with this type of medication.  If it causes excessive drowsiness in the morning then I would recommend stopping the medicine.  Also do not drive if feeling drowsy.  Finally long-term stretching exercises are often the better pathway to help out.  If necessary we can do physical therapy as well.  Please follow-up for office visit if any ongoing troubles.  Thanks-Dr. Lorin Picket

## 2022-09-22 MED ORDER — CYCLOBENZAPRINE HCL 5 MG PO TABS: ORAL_TABLET | ORAL | 0 refills | Status: DC

## 2022-09-27 ENCOUNTER — Ambulatory Visit: Payer: BC Managed Care – PPO | Admitting: Family Medicine

## 2022-11-08 ENCOUNTER — Ambulatory Visit: Payer: BC Managed Care – PPO | Admitting: Family Medicine

## 2022-12-01 ENCOUNTER — Other Ambulatory Visit: Payer: Self-pay

## 2022-12-01 ENCOUNTER — Other Ambulatory Visit: Payer: Self-pay | Admitting: Family Medicine

## 2022-12-01 ENCOUNTER — Telehealth: Payer: Self-pay

## 2022-12-01 MED ORDER — RIZATRIPTAN BENZOATE 10 MG PO TABS: 10.0000 mg | ORAL_TABLET | ORAL | 12 refills | Status: DC | PRN

## 2022-12-01 NOTE — Telephone Encounter (Signed)
Refills was sent and feel free to send her MyChart message please keep follow-up visit in January

## 2022-12-01 NOTE — Telephone Encounter (Signed)
Prescription Request  12/01/2022  LOV: Visit date not found  What is the name of the medication or equipment? rizatriptan (MAXALT) 10 MG tablet   Have you contacted your pharmacy to request a refill? Yes   Which pharmacy would you like this sent to?  [facility] [phone] - [place], [place] - [address] AT [facility] OF [address] [place] [address] Phone: [phone] Fax: [phone]    Patient notified that their request is being sent to the clinical staff for review and that they should receive a response within 2 business days.   Please advise at Mobile [phone] (mobile)

## 2022-12-31 ENCOUNTER — Telehealth: Payer: Self-pay | Admitting: Family Medicine

## 2022-12-31 ENCOUNTER — Other Ambulatory Visit: Payer: Self-pay | Admitting: Nurse Practitioner

## 2022-12-31 MED ORDER — ESCITALOPRAM OXALATE 20 MG PO TABS: 20.0000 mg | ORAL_TABLET | Freq: Every day | ORAL | 0 refills | Status: DC

## 2022-12-31 NOTE — Telephone Encounter (Signed)
Refill on  escitalopram (LEXAPRO) 20 MG tablet  send to [facility] [address]

## 2023-01-06 ENCOUNTER — Other Ambulatory Visit: Payer: Self-pay | Admitting: Family Medicine

## 2023-01-14 ENCOUNTER — Ambulatory Visit: Payer: BC Managed Care – PPO | Admitting: Family Medicine

## 2023-01-20 ENCOUNTER — Other Ambulatory Visit: Payer: Self-pay | Admitting: Family Medicine

## 2023-01-20 ENCOUNTER — Encounter: Payer: Self-pay | Admitting: Family Medicine

## 2023-01-20 MED ORDER — CYCLOBENZAPRINE HCL 5 MG PO TABS: ORAL_TABLET | ORAL | 3 refills | Status: DC

## 2023-03-03 ENCOUNTER — Encounter: Payer: Self-pay | Admitting: Family Medicine

## 2023-03-04 ENCOUNTER — Other Ambulatory Visit: Payer: Self-pay | Admitting: Family Medicine

## 2023-03-04 MED ORDER — ESCITALOPRAM OXALATE 10 MG PO TABS: 10.0000 mg | ORAL_TABLET | Freq: Every day | ORAL | 0 refills | Status: DC

## 2023-04-04 ENCOUNTER — Other Ambulatory Visit: Payer: Self-pay

## 2023-04-04 ENCOUNTER — Encounter: Payer: Self-pay | Admitting: Family Medicine

## 2023-04-04 MED ORDER — ESCITALOPRAM OXALATE 10 MG PO TABS: 10.0000 mg | ORAL_TABLET | Freq: Every day | ORAL | 0 refills | Status: DC

## 2023-04-05 ENCOUNTER — Other Ambulatory Visit: Payer: Self-pay | Admitting: Family Medicine

## 2023-04-05 MED ORDER — ESCITALOPRAM OXALATE 20 MG PO TABS: 20.0000 mg | ORAL_TABLET | Freq: Every day | ORAL | 1 refills | Status: DC

## 2023-05-18 ENCOUNTER — Ambulatory Visit: Payer: BC Managed Care – PPO | Admitting: Family Medicine

## 2023-05-20 ENCOUNTER — Encounter: Payer: Self-pay | Admitting: Family Medicine

## 2023-05-20 ENCOUNTER — Ambulatory Visit: Payer: Self-pay

## 2023-05-20 ENCOUNTER — Ambulatory Visit
Admission: EM | Admit: 2023-05-20 | Discharge: 2023-05-20 | Disposition: A | Discharge status: Home / Self Care | Attending: Family Medicine | Admitting: Family Medicine

## 2023-05-20 DIAGNOSIS — R1013 Epigastric pain: Secondary | ICD-10-CM

## 2023-05-20 MED ORDER — ALUM & MAG HYDROXIDE-SIMETH 200-200-20 MG/5ML PO SUSP
30.0000 mL | Freq: Once | ORAL | Status: AC
  Administered 2023-05-20: 30 mL via ORAL

## 2023-05-20 MED ORDER — SUCRALFATE 1 G PO TABS: 1.0000 g | ORAL_TABLET | Freq: Three times a day (TID) | ORAL | 0 refills | Status: DC | PRN

## 2023-05-20 MED ORDER — LIDOCAINE VISCOUS HCL 2 % MT SOLN
15.0000 mL | Freq: Once | OROMUCOSAL | Status: AC
  Administered 2023-05-20: 15 mL via OROMUCOSAL

## 2023-05-20 MED ORDER — PANTOPRAZOLE SODIUM 40 MG PO TBEC: 40.0000 mg | DELAYED_RELEASE_TABLET | Freq: Every day | ORAL | 0 refills | Status: DC

## 2023-05-20 NOTE — Telephone Encounter (Signed)
  Chief Complaint: abdominal pain Symptoms: pain, diarrhea Frequency: intermittent Pertinent Negatives: Patient denies fever Disposition: [] ED /[x] Urgent Care (no appt availability in office) / [] Appointment(In office/virtual)/ []  [facility] [place] [facility]/ [] Home Care/ [] Refused Recommended Disposition /[]  Mobile Bus/ []  Follow-up with PCP Additional Notes:  Abdominal pain started Wednesday that seemed to resolve. But today abdominal pain started ago in upper abdomen and radiates to umbilicus, describes pain as intermittent and  burning. She is having liquid stool. Current pain is 7/10 pain, states it can get as high as 10. Took Antacid without relief. Mild swelling upper abdomen also noted. Acute evaluation advised, none are available at practice locations, patient will proceed to urgent care.    Copied from CRM [id number]. Topic: Clinical - Red Word Triage >> May 20, 2023  2:23 PM [place] wrote: Red Word that prompted transfer to Nurse Triage: abdominal pain level 7-10 just below chest Reason for Disposition  [1] MILD-MODERATE pain AND [2] not relieved by antacid medicine  Protocols used: Abdominal Pain - Upper-A-AH

## 2023-05-20 NOTE — ED Triage Notes (Signed)
 Pt has burning in the middle of her stomach x 2 days   Took antacid meds but no relief.

## 2023-05-21 ENCOUNTER — Other Ambulatory Visit: Payer: Self-pay

## 2023-05-21 ENCOUNTER — Encounter (HOSPITAL_COMMUNITY): Payer: Self-pay | Admitting: Emergency Medicine

## 2023-05-21 ENCOUNTER — Emergency Department (HOSPITAL_COMMUNITY)

## 2023-05-21 ENCOUNTER — Emergency Department (HOSPITAL_COMMUNITY)
Admission: EM | Admit: 2023-05-21 | Discharge: 2023-05-21 | Disposition: A | Discharge status: Home / Self Care | Attending: Emergency Medicine | Admitting: Emergency Medicine

## 2023-05-21 DIAGNOSIS — N8311 Corpus luteum cyst of right ovary: Secondary | ICD-10-CM | POA: Diagnosis not present

## 2023-05-21 DIAGNOSIS — R109 Unspecified abdominal pain: Secondary | ICD-10-CM | POA: Diagnosis not present

## 2023-05-21 DIAGNOSIS — K5730 Diverticulosis of large intestine without perforation or abscess without bleeding: Secondary | ICD-10-CM | POA: Diagnosis not present

## 2023-05-21 DIAGNOSIS — K529 Noninfective gastroenteritis and colitis, unspecified: Secondary | ICD-10-CM | POA: Insufficient documentation

## 2023-05-21 DIAGNOSIS — R933 Abnormal findings on diagnostic imaging of other parts of digestive tract: Secondary | ICD-10-CM | POA: Diagnosis not present

## 2023-05-21 DIAGNOSIS — R1084 Generalized abdominal pain: Secondary | ICD-10-CM | POA: Diagnosis not present

## 2023-05-21 LAB — COMPREHENSIVE METABOLIC PANEL WITH GFR
ALT: 19 U/L (ref 0–44)
AST: 15 U/L (ref 15–41)
Albumin: 4.3 g/dL (ref 3.5–5.0)
Alkaline Phosphatase: 65 U/L (ref 38–126)
Anion gap: 11 (ref 5–15)
BUN: 10 mg/dL (ref 6–20)
CO2: 21 mmol/L — ABNORMAL LOW (ref 22–32)
Calcium: 8.7 mg/dL — ABNORMAL LOW (ref 8.9–10.3)
Chloride: 98 mmol/L (ref 98–111)
Creatinine, Ser: 0.75 mg/dL (ref 0.44–1.00)
GFR, Estimated: >60 mL/min (ref >60)
Glucose, Bld: 129 mg/dL — ABNORMAL HIGH (ref 70–99)
Potassium: 3.8 mmol/L (ref 3.5–5.1)
Sodium: 130 mmol/L — ABNORMAL LOW (ref 135–145)
Total Bilirubin: 1 mg/dL (ref 0.0–1.2)
Total Protein: 7.3 g/dL (ref 6.5–8.1)

## 2023-05-21 LAB — CBC
HCT: 46.9 % — ABNORMAL HIGH (ref 36.0–46.0)
Hemoglobin: 16.6 g/dL — ABNORMAL HIGH (ref 12.0–15.0)
MCH: 32.2 pg (ref 26.0–34.0)
MCHC: 35.4 g/dL (ref 30.0–36.0)
MCV: 90.9 fL (ref 80.0–100.0)
Platelets: 306 10*3/uL (ref 150–400)
RBC: 5.16 MIL/uL — ABNORMAL HIGH (ref 3.87–5.11)
RDW: 12.2 % (ref 11.5–15.5)
WBC: 10.3 10*3/uL (ref 4.0–10.5)
nRBC: 0 % (ref 0.0–0.2)

## 2023-05-21 LAB — URINALYSIS, ROUTINE W REFLEX MICROSCOPIC
Bilirubin Urine: NEGATIVE
Glucose, UA: NEGATIVE mg/dL
Hgb urine dipstick: NEGATIVE
Ketones, ur: 20 mg/dL — AB
Leukocytes,Ua: NEGATIVE
Nitrite: NEGATIVE
Protein, ur: NEGATIVE mg/dL
Specific Gravity, Urine: 1.003 — ABNORMAL LOW (ref 1.005–1.030)
pH: 6 (ref 5.0–8.0)

## 2023-05-21 LAB — LIPASE, BLOOD: Lipase: 28 U/L (ref 11–51)

## 2023-05-21 LAB — POC URINE PREG, ED: Preg Test, Ur: NEGATIVE

## 2023-05-21 MED ORDER — SODIUM CHLORIDE 0.9 % IV BOLUS
1000.0000 mL | Freq: Once | INTRAVENOUS | Status: AC
  Administered 2023-05-21: 1000 mL via INTRAVENOUS

## 2023-05-21 MED ORDER — IOHEXOL 300 MG/ML  SOLN
100.0000 mL | Freq: Once | INTRAMUSCULAR | Status: AC | PRN
  Administered 2023-05-21: 100 mL via INTRAVENOUS

## 2023-05-21 MED ORDER — OXYCODONE HCL 5 MG PO CAPS: 5.0000 mg | ORAL_CAPSULE | Freq: Four times a day (QID) | ORAL | 0 refills | Status: DC | PRN

## 2023-05-21 MED ORDER — MORPHINE SULFATE (PF) 4 MG/ML IV SOLN
4.0000 mg | Freq: Once | INTRAVENOUS | Status: AC
  Administered 2023-05-21: 4 mg via INTRAVENOUS
  Filled 2023-05-21: qty 1

## 2023-05-21 MED ORDER — DICYCLOMINE HCL 20 MG PO TABS: 20.0000 mg | ORAL_TABLET | Freq: Two times a day (BID) | ORAL | 0 refills | Status: DC

## 2023-05-21 MED ORDER — ONDANSETRON HCL 4 MG/2ML IJ SOLN
4.0000 mg | Freq: Once | INTRAMUSCULAR | Status: AC
Start: 2023-05-21 — End: 2023-05-21
  Administered 2023-05-21: 4 mg via INTRAVENOUS
  Filled 2023-05-21: qty 2

## 2023-05-21 NOTE — ED Provider Notes (Signed)
 [place] EMERGENCY DEPARTMENT AT [facility] Provider Note   CSN: [id number] Arrival date & time: 05/21/23  1635     History  Chief Complaint  Patient presents with   Abdominal Pain    Kara Schwartz is a 38 y.o. female.  Patient with no pertinent past medical history presents today with complaints of abdominal pain. She states that same began initially on Wednesday with pain in the epigastric region of her abdomen and felt like a burning sensation. States that she went to urgent care yesterday and was prescribed antacid medications which she has been taking with some improvement.  However, today she states her pain is moved to her lower abdomen as well.  She does endorse several episodes of nonbloody diarrhea with nausea.  No vomiting.  No history of similar symptoms previously.  No history of abdominal surgeries.  No urinary symptoms.   The history is provided by the patient. No language interpreter was used.  Abdominal Pain      Home Medications Prior to Admission medications   Medication Sig Start Date End Date Taking? Authorizing Provider  acetaminophen  (TYLENOL ) 325 MG tablet Take 650 mg by mouth every 6 (six) hours as needed. For pain.     [provider]  cyclobenzaprine  (FLEXERIL ) 5 MG tablet TAKE 1 TABLET BY MOUTH EVERY NIGHT AS NEEDED 01/20/23   Bennet Brasil, MD  escitalopram  (LEXAPRO ) 20 MG tablet Take 1 tablet (20 mg total) by mouth daily. 04/05/23   Bennet Brasil, MD  ibuprofen  (ADVIL ) 200 MG tablet Take 200 mg by mouth every 6 (six) hours as needed.    [provider]  pantoprazole  (PROTONIX ) 40 MG tablet Take 1 tablet (40 mg total) by mouth daily. 05/20/23   Corbin Dess, PA-C  potassium chloride  (KLOR-CON  M) 10 MEQ tablet Take one tablet po q am prn Patient not taking: Reported on 06/01/2022 10/20/21   Bennet Brasil, MD  rizatriptan  (MAXALT ) 10 MG tablet Take 1 tablet (10 mg total) by mouth as needed for migraine. May  repeat in 2 hours if needed 12/01/22   Bennet Brasil, MD  sucralfate  (CARAFATE ) 1 g tablet Take 1 tablet (1 g total) by mouth 3 (three) times daily as needed. May dissolve 1 tablet into a glass of water and drink up to 3 times daily as needed 05/20/23   Corbin Dess, PA-C      Allergies    Hydrochlorothiazide     Review of Systems   Review of Systems  Gastrointestinal:  Positive for abdominal pain.  All other systems reviewed and are negative.   Physical Exam Updated Vital Signs BP (!) 192/139 (BP Location: Right Arm)   Pulse 91   Temp 97.8 F (36.6 C) (Oral)   Resp 18   Ht 5\' 4"  (1.626 m)   Wt 86.2 kg   LMP 05/06/2023   SpO2 95%   BMI 32.61 kg/m  Physical Exam Vitals and nursing note reviewed.  Constitutional:      General: She is not in acute distress.    Appearance: Normal appearance. She is normal weight. She is not ill-appearing, toxic-appearing or diaphoretic.  HENT:     Head: Normocephalic and atraumatic.  Cardiovascular:     Rate and Rhythm: Normal rate.  Pulmonary:     Effort: Pulmonary effort is normal. No respiratory distress.  Abdominal:     General: Abdomen is flat.     Palpations: Abdomen is soft.  Tenderness: There is generalized abdominal tenderness. There is no guarding or rebound.  Musculoskeletal:        General: Normal range of motion.     Cervical back: Normal range of motion.  Skin:    General: Skin is warm and dry.  Neurological:     General: No focal deficit present.     Mental Status: She is alert.  Psychiatric:        Mood and Affect: Mood normal.        Behavior: Behavior normal.     ED Results / Procedures / Treatments   Labs (all labs ordered are listed, but only abnormal results are displayed) Labs Reviewed  COMPREHENSIVE METABOLIC PANEL WITH GFR - Abnormal; Notable for the following components:      Result Value   Sodium 130 (*)    CO2 21 (*)    Glucose, Bld 129 (*)    Calcium 8.7 (*)    All other components  within normal limits  CBC - Abnormal; Notable for the following components:   RBC 5.16 (*)    Hemoglobin 16.6 (*)    HCT 46.9 (*)    All other components within normal limits  URINALYSIS, ROUTINE W REFLEX MICROSCOPIC - Abnormal; Notable for the following components:   Color, Urine STRAW (*)    Specific Gravity, Urine 1.003 (*)    Ketones, ur 20 (*)    All other components within normal limits  LIPASE, BLOOD  POC URINE PREG, ED    EKG None  Radiology CT ABDOMEN PELVIS W CONTRAST Result Date: 05/21/2023 CLINICAL DATA:  Abdominal pain, acute, nonlocalized EXAM: CT ABDOMEN AND PELVIS WITH CONTRAST TECHNIQUE: Multidetector CT imaging of the abdomen and pelvis was performed using the standard protocol following bolus administration of intravenous contrast. RADIATION DOSE REDUCTION: This exam was performed according to the departmental dose-optimization program which includes automated exposure control, adjustment of the mA and/or kV according to patient size and/or use of iterative reconstruction technique. CONTRAST:  100mL OMNIPAQUE IOHEXOL 300 MG/ML  SOLN COMPARISON:  None Available. FINDINGS: Lower chest: No acute abnormality. Hepatobiliary: No focal liver abnormality is seen. No gallstones, gallbladder wall thickening, or biliary dilatation. Pancreas: Unremarkable. No pancreatic ductal dilatation or surrounding inflammatory changes. Spleen: Normal in size without focal abnormality. Adrenals/Urinary Tract: Adrenal glands are unremarkable. Kidneys are normal, without renal calculi, focal lesion, or hydronephrosis. Bladder is unremarkable. Stomach/Bowel: No evidence of bowel obstruction. There is marked circumferential wall thickening with submucosal enhancement of the distal ileum extending to the terminal ileum. There is interdigitating adjacent fluid. Submucosal fat deposition throughout the colon which is otherwise relatively decompressed. Diverticulosis of the descending colon and sigmoid colon.  Appendix is normal. Stomach is decompressed. Vascular/Lymphatic: No significant vascular findings are present. No enlarged abdominal or pelvic lymph nodes. Reproductive: Uterus and bilateral adnexa are unremarkable. Crenulated RIGHT ovarian cyst reflecting a corpus luteum (for which no dedicated imaging follow-up is recommended). Other: Small volume free fluid throughout all 4 quadrants of the abdomen. Musculoskeletal: No acute or significant osseous findings. IMPRESSION: 1. There is marked circumferential wall thickening with submucosal enhancement of the distal ileum extending to the terminal ileum. Findings are most consistent with an infectious or inflammatory enteritis. Recommend correlation with laboratory values for an etiology such as Crohn's. 2. Small volume free fluid throughout all 4 quadrants of the abdomen. Electronically Signed   By: Clancy Crimes M.D.   On: 05/21/2023 19:57    Procedures Procedures    Medications Ordered in  ED Medications  morphine (PF) 4 MG/ML injection 4 mg (has no administration in time range)  ondansetron  (ZOFRAN ) injection 4 mg (has no administration in time range)    ED Course/ Medical Decision Making/ A&P                                 Medical Decision Making Amount and/or Complexity of Data Reviewed Labs: ordered. Radiology: ordered.  Risk Prescription drug management.   This patient is a 38 y.o. female who presents to the ED for concern of abdominal pain, this involves an extensive number of treatment options, and is a complaint that carries with it a high risk of complications and morbidity. The emergent differential diagnosis prior to evaluation includes, but is not limited to,  The differential diagnosis for generalized abdominal pain includes, but is not limited to AAA, gastroenteritis, appendicitis, Bowel obstruction, Bowel perforation. Gastroparesis, DKA, Hernia, Inflammatory bowel disease, mesenteric ischemia, pancreatitis, peritonitis  SBP, volvulus.   This is not an exhaustive differential.   Past Medical History / Co-morbidities / Social History:  has a past medical history of Headache(784.0), Hypertriglyceridemia, Insomnia (07/31/2018), and No pertinent past medical history.  Additional history: Chart reviewed.   Physical Exam: Physical exam performed. The pertinent findings include: generalized abdominal TTP   Lab Tests: I ordered, and personally interpreted labs.  The pertinent results include:  Na 130, UA with ketones   Imaging Studies: I ordered imaging studies including Ct abdomen pelvis. I independently visualized and interpreted imaging which showed   1. There is marked circumferential wall thickening with submucosal enhancement of the distal ileum extending to the terminal ileum. Findings are most consistent with an infectious or inflammatory enteritis. Recommend correlation with laboratory values for an etiology such as Crohn's. 2. Small volume free fluid throughout all 4 quadrants of the abdomen.   I agree with the radiologist interpretation.   Medications: I ordered medication including zofran , fluids, morphine  for dehydration, nausea, pain. Reevaluation of the patient after these medicines showed that the patient improved. I have reviewed the patients home medicines and have made adjustments as needed.  Disposition: After consideration of the diagnostic results and the patients response to treatment, I feel that emergency department workup does not suggest an emergent condition requiring admission or immediate intervention beyond what has been performed at this time. The plan is: discharge with pain medication, GI referral for follow-up and return precautions.  Upon reassessment after above interventions, patient is feeling better and ready to go home.  She denies any history of similar symptoms previously.  Did discuss CT findings with her and mention Crohn's disease and will give referral for  follow-up.  Will send for Bentyl for pain.  Will also give a few doses of oxycodone .  PDMP reviewed.  Patient advised not to drive or operate heavy machinery while taking this medicine. Evaluation and diagnostic testing in the emergency department does not suggest an emergent condition requiring admission or immediate intervention beyond what has been performed at this time.  Plan for discharge with close PCP follow-up.  Patient is understanding and amenable with plan, educated on red flag symptoms that would prompt immediate return.  Patient discharged in stable condition.   Final Clinical Impression(s) / ED Diagnoses Final diagnoses:  Enteritis    Rx / DC Orders ED Discharge Orders          Ordered    dicyclomine (BENTYL) 20 MG tablet  2 times daily        05/21/23 2103    oxycodone  (OXY-IR) 5 MG capsule  Every 6 hours PRN        05/21/23 2103          An After Visit Summary was printed and given to the patient.     Fredna Jasper 05/21/23 2104    Early Glisson, MD 05/22/23 [phone]

## 2023-05-21 NOTE — Discharge Instructions (Addendum)
 As we discussed, the ER today was overall reassuring for acute findings.  Laboratory evaluation and CT imaging did not reveal any emergent cause of your symptoms.  It did however show some inflammation in the walls of your small intestine at your ileum.  We discussed that this could be either viral, bacterial, or a condition known as Crohn's.  Ultimately, you need to see a GI specialist to have this further evaluated.  I have given you a referral with a number to call to schedule an appointment.  In the interim, please start taking a probiotic every day at least for the next week.  I have also given you a prescription for Bentyl which is a antispasmodic medication that can help with stomach cramps.  Take this as prescribed as needed.  I have also given you a few doses of oxycodone .  This is a narcotic pain medicine and you should not drive or operate heavy machinery while taking this medication.  Please take this as prescribed as needed for severe breakthrough pain only.  Return if development of any new or worsening symptoms.

## 2023-05-21 NOTE — ED Triage Notes (Signed)
 Pt to ER with c/o LLQ abdominal pain since Wednesday.  Was seen at [facility] yesterday and was treated for reflux, pain has returned today.  States pain started more central and today has settled more to the left lower.  Describes as a burning pain.  Nausea, no vomiting, diarrhea since Wednesday.

## 2023-05-23 ENCOUNTER — Telehealth: Payer: Self-pay | Admitting: Family Medicine

## 2023-05-23 ENCOUNTER — Other Ambulatory Visit: Payer: Self-pay | Admitting: Nurse Practitioner

## 2023-05-23 DIAGNOSIS — K529 Noninfective gastroenteritis and colitis, unspecified: Secondary | ICD-10-CM

## 2023-05-23 NOTE — ED Provider Notes (Signed)
 [facility]    CSN: [id number] Arrival date & time: 05/20/23  1502      History   Chief Complaint Chief Complaint  Patient presents with   Gastroesophageal Reflux    HPI Kara Schwartz is a 38 y.o. female.   Patient presenting today with 2-day history of mid abdominal pain, intermittent nausea.  Denies fever, chills, vomiting, diarrhea, hematemesis, melena, upper respiratory symptoms, new foods or medications.  Trying antacids with minimal relief.  No known sick contacts recently.  No past abdominal surgeries or chronic GI issues per patient.   Past Medical History:  Diagnosis Date   Headache(784.0)    Hypertriglyceridemia    Insomnia 07/31/2018   No pertinent past medical history     Patient Active Problem List   Diagnosis Date Noted   Insomnia 07/31/2018   Migraine without aura and without status migrainosus, not intractable 08/14/2014   Anxiety and depression 08/30/2012    Past Surgical History:  Procedure Laterality Date   AUGMENTATION MAMMAPLASTY Bilateral    implants   BREAST ENHANCEMENT SURGERY  2008    OB History     Gravida  1   Para  1   Term  1   Preterm  0   AB  0   Living  1      SAB  0   IAB  0   Ectopic  0   Multiple  0   Live Births  1            Home Medications    Prior to Admission medications   Medication Sig Start Date End Date Taking? Authorizing Provider  pantoprazole  (PROTONIX ) 40 MG tablet Take 1 tablet (40 mg total) by mouth daily. 05/20/23  Yes Corbin Dess, PA-C  sucralfate  (CARAFATE ) 1 g tablet Take 1 tablet (1 g total) by mouth 3 (three) times daily as needed. May dissolve 1 tablet into a glass of water and drink up to 3 times daily as needed 05/20/23  Yes Kaye Parsons, Morey Ar, PA-C  acetaminophen  (TYLENOL ) 325 MG tablet Take 650 mg by mouth every 6 (six) hours as needed. For pain.     [provider]  cyclobenzaprine  (FLEXERIL ) 5 MG tablet TAKE 1 TABLET BY MOUTH EVERY  NIGHT AS NEEDED 01/20/23   Bennet Brasil, MD  dicyclomine  (BENTYL ) 20 MG tablet Take 1 tablet (20 mg total) by mouth 2 (two) times daily. 05/21/23   Smoot, Sarah A, PA-C  escitalopram  (LEXAPRO ) 20 MG tablet Take 1 tablet (20 mg total) by mouth daily. 04/05/23   Bennet Brasil, MD  ibuprofen  (ADVIL ) 200 MG tablet Take 200 mg by mouth every 6 (six) hours as needed.    [provider]  oxycodone  (OXY-IR) 5 MG capsule Take 1 capsule (5 mg total) by mouth every 6 (six) hours as needed for up to 3 days for pain (severe breakthrough pain). 05/21/23 05/24/23  Smoot, Genevive Ket, PA-C  potassium chloride  (KLOR-CON  M) 10 MEQ tablet Take one tablet po q am prn Patient not taking: Reported on 06/01/2022 10/20/21   Bennet Brasil, MD  rizatriptan  (MAXALT ) 10 MG tablet Take 1 tablet (10 mg total) by mouth as needed for migraine. May repeat in 2 hours if needed 12/01/22   Bennet Brasil, MD    Family History Family History  Problem Relation Age of Onset   Diabetes Father    Stroke Father    Anesthesia problems Neg Hx    Hypotension  Neg Hx    Malignant hyperthermia Neg Hx    Pseudochol deficiency Neg Hx     Social History Social History   Tobacco Use   Smoking status: Never   Smokeless tobacco: Never  Substance Use Topics   Alcohol use: Yes    Comment: occasionally   Drug use: No     Allergies   Hydrochlorothiazide    Review of Systems Review of Systems PER HPI  Physical Exam Triage Vital Signs ED Triage Vitals  Encounter Vitals Group     BP 05/20/23 1517 (!) 160/123     Systolic BP Percentile --      Diastolic BP Percentile --      Pulse Rate 05/20/23 1517 81     Resp 05/20/23 1517 20     Temp 05/20/23 1517 97.6 F (36.4 C)     Temp Source 05/20/23 1517 Oral     SpO2 05/20/23 1517 97 %     Weight --      Height --      Head Circumference --      Peak Flow --      Pain Score 05/20/23 1516 10     Pain Loc --      Pain Education --      Exclude from Growth Chart --     No data found.  Updated Vital Signs BP (!) 160/123 (BP Location: Right Arm)   Pulse 81   Temp 97.6 F (36.4 C) (Oral)   Resp 20   LMP 05/06/2023   SpO2 97%   Visual Acuity Right Eye Distance:   Left Eye Distance:   Bilateral Distance:    Right Eye Near:   Left Eye Near:    Bilateral Near:     Physical Exam Vitals and nursing note reviewed.  Constitutional:      Appearance: Normal appearance. She is not ill-appearing.  HENT:     Head: Atraumatic.     Mouth/Throat:     Mouth: Mucous membranes are moist.  Eyes:     Extraocular Movements: Extraocular movements intact.     Conjunctiva/sclera: Conjunctivae normal.  Cardiovascular:     Rate and Rhythm: Normal rate and regular rhythm.     Heart sounds: Normal heart sounds.  Pulmonary:     Effort: Pulmonary effort is normal.     Breath sounds: Normal breath sounds.  Abdominal:     General: Bowel sounds are normal. There is no distension.     Palpations: Abdomen is soft.     Tenderness: There is abdominal tenderness. There is no right CVA tenderness, left CVA tenderness or guarding.     Comments: Mid abdominal and epigastric tenderness to palpation without distention or guarding  Musculoskeletal:        General: Normal range of motion.     Cervical back: Normal range of motion and neck supple.  Skin:    General: Skin is warm and dry.  Neurological:     Mental Status: She is alert and oriented to person, place, and time.  Psychiatric:        Mood and Affect: Mood normal.        Thought Content: Thought content normal.        Judgment: Judgment normal.      UC Treatments / Results  Labs (all labs ordered are listed, but only abnormal results are displayed) Labs Reviewed - No data to display  EKG   Radiology CT ABDOMEN PELVIS W CONTRAST Result Date: 05/21/2023  CLINICAL DATA:  Abdominal pain, acute, nonlocalized EXAM: CT ABDOMEN AND PELVIS WITH CONTRAST TECHNIQUE: Multidetector CT imaging of the abdomen and  pelvis was performed using the standard protocol following bolus administration of intravenous contrast. RADIATION DOSE REDUCTION: This exam was performed according to the departmental dose-optimization program which includes automated exposure control, adjustment of the mA and/or kV according to patient size and/or use of iterative reconstruction technique. CONTRAST:  100mL OMNIPAQUE  IOHEXOL  300 MG/ML  SOLN COMPARISON:  None Available. FINDINGS: Lower chest: No acute abnormality. Hepatobiliary: No focal liver abnormality is seen. No gallstones, gallbladder wall thickening, or biliary dilatation. Pancreas: Unremarkable. No pancreatic ductal dilatation or surrounding inflammatory changes. Spleen: Normal in size without focal abnormality. Adrenals/Urinary Tract: Adrenal glands are unremarkable. Kidneys are normal, without renal calculi, focal lesion, or hydronephrosis. Bladder is unremarkable. Stomach/Bowel: No evidence of bowel obstruction. There is marked circumferential wall thickening with submucosal enhancement of the distal ileum extending to the terminal ileum. There is interdigitating adjacent fluid. Submucosal fat deposition throughout the colon which is otherwise relatively decompressed. Diverticulosis of the descending colon and sigmoid colon. Appendix is normal. Stomach is decompressed. Vascular/Lymphatic: No significant vascular findings are present. No enlarged abdominal or pelvic lymph nodes. Reproductive: Uterus and bilateral adnexa are unremarkable. Crenulated RIGHT ovarian cyst reflecting a corpus luteum (for which no dedicated imaging follow-up is recommended). Other: Small volume free fluid throughout all 4 quadrants of the abdomen. Musculoskeletal: No acute or significant osseous findings. IMPRESSION: 1. There is marked circumferential wall thickening with submucosal enhancement of the distal ileum extending to the terminal ileum. Findings are most consistent with an infectious or inflammatory  enteritis. Recommend correlation with laboratory values for an etiology such as Crohn's. 2. Small volume free fluid throughout all 4 quadrants of the abdomen. Electronically Signed   By: Clancy Crimes M.D.   On: 05/21/2023 19:57    Procedures Procedures (including critical care time)  Medications Ordered in UC Medications  alum & mag hydroxide-simeth (MAALOX/MYLANTA) 200-200-20 MG/5ML suspension 30 mL (30 mLs Oral Given 05/20/23 1655)  lidocaine  (XYLOCAINE ) 2 % viscous mouth solution 15 mL (15 mLs Mouth/Throat Given 05/20/23 1655)    Initial Impression / Assessment and Plan / UC Course  I have reviewed the triage vital signs and the nursing notes.  Pertinent labs & imaging results that were available during my care of the patient were reviewed by me and considered in my medical decision making (see chart for details).     Hypertensive in triage, otherwise vital signs within normal limits.  She is well-appearing and in no acute distress.  No red flag findings on exam.  Possibly gastritis, will trial GI cocktail, Carafate , Protonix , supportive home care.  Return for any worsening symptoms.  Final Clinical Impressions(s) / UC Diagnoses   Final diagnoses:  Abdominal pain, epigastric   Discharge Instructions   None    ED Prescriptions     Medication Sig Dispense Auth. Provider   sucralfate  (CARAFATE ) 1 g tablet Take 1 tablet (1 g total) by mouth 3 (three) times daily as needed. May dissolve 1 tablet into a glass of water and drink up to 3 times daily as needed 60 tablet Corbin Dess, PA-C   pantoprazole  (PROTONIX ) 40 MG tablet Take 1 tablet (40 mg total) by mouth daily. 30 tablet Corbin Dess, [place]      PDMP not reviewed this encounter.   Corbin Dess, PA-C 05/23/23 1347

## 2023-05-23 NOTE — Telephone Encounter (Signed)
 Copied from CRM [id number]. Topic: General - Call Back - No Documentation >> May 23, 2023 10:08 AM Stanly Early wrote: Reason for CRM: soon as possible the patient would like a callback [phone]

## 2023-05-24 ENCOUNTER — Encounter: Payer: Self-pay | Admitting: Nurse Practitioner

## 2023-05-24 ENCOUNTER — Ambulatory Visit: Admitting: Nurse Practitioner

## 2023-05-24 VITALS — BP 150/113 | HR 80 | Temp 98.6°F | Ht 64.0 in | Wt 186.0 lb

## 2023-05-24 DIAGNOSIS — R030 Elevated blood-pressure reading, without diagnosis of hypertension: Secondary | ICD-10-CM | POA: Diagnosis not present

## 2023-05-24 DIAGNOSIS — F419 Anxiety disorder, unspecified: Secondary | ICD-10-CM

## 2023-05-24 DIAGNOSIS — F32A Depression, unspecified: Secondary | ICD-10-CM

## 2023-05-24 MED ORDER — VALSARTAN 80 MG PO TABS: 80.0000 mg | ORAL_TABLET | Freq: Every day | ORAL | 0 refills | Status: DC

## 2023-05-24 NOTE — Progress Notes (Signed)
 Subjective:    Patient ID: Kara Schwartz, female    DOB: [birth year], 38 y.o.   MRN: [id number]  HPI Presents for complaints of elevated blood pressure for the past several readings.  Has been seen at ED on 05/21/2023 diagnosed with enteritis.  Has an upcoming appointment with GI specialist in 2 days.  No previous history of elevation significant enough to require medication.  Was on an over-the-counter supplement that really helped her energy level but has been off of this for at least a week.  Has been eating a bland diet with lots of fluids due to her abdominal issues.  Denies any tobacco use.  Of note patient had an allergic reaction to hydrochlorothiazide  in the past.  Has some concerns about escitalopram  as far as some weight gain but doing well on the 20 mg as far as her mood and anxiety.   Review of Systems  Constitutional:  Positive for fatigue. Negative for fever.  Respiratory:  Negative for cough, chest tightness, shortness of breath and wheezing.   Cardiovascular:  Positive for leg swelling. Negative for chest pain and palpitations.       Occasional mild edema of the lower extremities.  Neurological:  Negative for facial asymmetry, speech difficulty, weakness and numbness.      05/24/2023   10:30 AM  Depression screen PHQ 2/9  Decreased Interest 0  Down, Depressed, Hopeless 0  PHQ - 2 Score 0  Altered sleeping 0  Tired, decreased energy 0  Change in appetite 0  Feeling bad or failure about yourself  0  Trouble concentrating 0  Moving slowly or fidgety/restless 0  Suicidal thoughts 0  PHQ-9 Score 0  Difficult doing work/chores Not difficult at all      05/24/2023   10:30 AM 06/01/2022    9:37 AM 07/28/2020    2:36 PM  GAD 7 : Generalized Anxiety Score  Nervous, Anxious, on Edge 0 1 1  Control/stop worrying 0 1 3  Worry too much - different things 0 1 3  Trouble relaxing 0 1 1  Restless 0 0 0  Easily annoyed or irritable 0 0 2  Afraid - awful might happen 0 0 0   Total GAD 7 Score 0 4 10  Anxiety Difficulty Not difficult at all Somewhat difficult Somewhat difficult   Social History   Tobacco Use   Smoking status: Never   Smokeless tobacco: Never  Substance Use Topics   Alcohol use: Yes    Comment: occasionally   Drug use: No         Objective:   Physical Exam Vitals and nursing note reviewed.  Constitutional:      General: She is not in acute distress. Cardiovascular:     Rate and Rhythm: Normal rate and regular rhythm.     Heart sounds: Normal heart sounds. No murmur heard.    No gallop.  Pulmonary:     Effort: Pulmonary effort is normal.     Breath sounds: Normal breath sounds.  Neurological:     Mental Status: She is alert.  Psychiatric:        Mood and Affect: Mood normal.        Behavior: Behavior normal.        Judgment: Judgment normal.    Today's Vitals   05/24/23 1027  BP: (!) 150/113  Pulse: 80  Temp: 98.6 F (37 C)  SpO2: 98%  Weight: 186 lb (84.4 kg)  Height: 5\' 4"  (1.626 m)  Body mass index is 31.93 kg/m. This BP reading is in the same range as the last 2 noted in the chart. 05/21/2023: Labs show GFR greater than 60, potassium 3.8.  Sodium slightly low at 130 but noted patient had multiple episodes of diarrhea at that time.       Assessment & Plan:   Problem List Items Addressed This Visit       Other   Anxiety and depression   Other Visit Diagnoses       Elevated BP without diagnosis of hypertension    -  Primary      Meds ordered this encounter  Medications   valsartan (DIOVAN) 80 MG tablet    Sig: Take 1 tablet (80 mg total) by mouth daily. For BP    Dispense:  30 tablet    Refill:  0    Supervising Provider:   Charlotta Cook A [[id number]]   Due to the extreme elevation in her blood pressure, we will start valsartan 80 mg daily.  Patient has access to a BP monitor and will check her BP at least once a day and send results by the end of the week.  Advised that once her physical condition  improves, her BP may come back to normal and to monitor closely in case she has to stop medication.  If no improvement in her blood pressure, recommend increasing valsartan dose and/or further workup.  Warning signs reviewed.  Call back if any problems.  Otherwise follow-up with GI specialist this week.

## 2023-05-26 DIAGNOSIS — K219 Gastro-esophageal reflux disease without esophagitis: Secondary | ICD-10-CM | POA: Diagnosis not present

## 2023-05-26 DIAGNOSIS — R1033 Periumbilical pain: Secondary | ICD-10-CM | POA: Diagnosis not present

## 2023-05-26 DIAGNOSIS — R194 Change in bowel habit: Secondary | ICD-10-CM | POA: Diagnosis not present

## 2023-05-26 DIAGNOSIS — R933 Abnormal findings on diagnostic imaging of other parts of digestive tract: Secondary | ICD-10-CM | POA: Diagnosis not present

## 2023-06-03 DIAGNOSIS — K635 Polyp of colon: Secondary | ICD-10-CM | POA: Diagnosis not present

## 2023-06-03 DIAGNOSIS — D122 Benign neoplasm of ascending colon: Secondary | ICD-10-CM | POA: Diagnosis not present

## 2023-06-03 DIAGNOSIS — R194 Change in bowel habit: Secondary | ICD-10-CM | POA: Diagnosis not present

## 2023-06-03 DIAGNOSIS — R197 Diarrhea, unspecified: Secondary | ICD-10-CM | POA: Diagnosis not present

## 2023-06-03 DIAGNOSIS — K5730 Diverticulosis of large intestine without perforation or abscess without bleeding: Secondary | ICD-10-CM | POA: Diagnosis not present

## 2023-06-03 DIAGNOSIS — R933 Abnormal findings on diagnostic imaging of other parts of digestive tract: Secondary | ICD-10-CM | POA: Diagnosis not present

## 2023-06-03 DIAGNOSIS — Z1211 Encounter for screening for malignant neoplasm of colon: Secondary | ICD-10-CM | POA: Diagnosis not present

## 2023-06-03 DIAGNOSIS — K6389 Other specified diseases of intestine: Secondary | ICD-10-CM | POA: Diagnosis not present

## 2023-06-03 LAB — HM COLONOSCOPY

## 2023-06-06 ENCOUNTER — Encounter: Payer: Self-pay | Admitting: Nurse Practitioner

## 2023-06-06 ENCOUNTER — Inpatient Hospital Stay: Admitting: Family Medicine

## 2023-06-06 NOTE — Telephone Encounter (Signed)
 Nurses I read through her message as well as her blood pressure readings on the attachment Currently she is taking the valsartan  80 mg twice daily When the blood pressure medication is started it can take up to 2 to 3 weeks to see the full effects.  I am okay with her taking 80 mg twice daily but it would be easier and efficient for her to take 160 mg 1/day-the medication is a 24-hour medicine so therefore does not need to be taken twice daily  It would be reasonable to send in Diovan  160 mg, #30, 1 daily She can monitor her blood pressure if for some reason it starts going significantly low we would need to reduce back down to 80 mg/day  I would also recommend a metabolic 7 in approximately 7 days time through [facility] to make sure her kidney functions and potassium are tolerating the medicine  I would also recommend a follow-up office visit mid to late next week with myself or Orelia Binet to recheck her blood pressure-please bring blood pressure cuff with her when she comes  Thanks-Dr. Geralyn Knee

## 2023-06-07 ENCOUNTER — Other Ambulatory Visit: Payer: Self-pay

## 2023-06-07 DIAGNOSIS — R030 Elevated blood-pressure reading, without diagnosis of hypertension: Secondary | ICD-10-CM

## 2023-06-07 MED ORDER — VALSARTAN 160 MG PO TABS: 160.0000 mg | ORAL_TABLET | Freq: Every day | ORAL | 1 refills | Status: DC

## 2023-06-08 ENCOUNTER — Ambulatory Visit: Payer: Self-pay | Admitting: Family Medicine

## 2023-06-18 ENCOUNTER — Encounter: Payer: Self-pay | Admitting: Family Medicine

## 2023-06-20 ENCOUNTER — Other Ambulatory Visit: Payer: Self-pay | Admitting: Family Medicine

## 2023-06-20 MED ORDER — AMOXICILLIN 500 MG PO CAPS: 500.0000 mg | ORAL_CAPSULE | Freq: Three times a day (TID) | ORAL | 0 refills | Status: AC

## 2023-06-21 ENCOUNTER — Encounter: Payer: Self-pay | Admitting: Family Medicine

## 2023-06-21 DIAGNOSIS — K580 Irritable bowel syndrome with diarrhea: Secondary | ICD-10-CM | POA: Diagnosis not present

## 2023-06-21 DIAGNOSIS — R030 Elevated blood-pressure reading, without diagnosis of hypertension: Secondary | ICD-10-CM | POA: Diagnosis not present

## 2023-06-22 ENCOUNTER — Ambulatory Visit: Payer: Self-pay | Admitting: Family Medicine

## 2023-06-22 LAB — BASIC METABOLIC PANEL WITH GFR
BUN/Creatinine Ratio: 7 — ABNORMAL LOW (ref 9–23)
BUN: 5 mg/dL — ABNORMAL LOW (ref 6–20)
CO2: 22 mmol/L (ref 20–29)
Calcium: 9.3 mg/dL (ref 8.7–10.2)
Chloride: 100 mmol/L (ref 96–106)
Creatinine, Ser: 0.69 mg/dL (ref 0.57–1.00)
Glucose: 88 mg/dL (ref 70–99)
Potassium: 4.3 mmol/L (ref 3.5–5.2)
Sodium: 139 mmol/L (ref 134–144)
eGFR: 114 mL/min/{1.73_m2} (ref >59)

## 2023-06-22 NOTE — Telephone Encounter (Signed)
 Spoke with patient. She has taken 2-3 doses of the amoxicillin . She is wanting to take a couple more doses of abx and wait until Friday 6/20 to be seen

## 2023-06-24 ENCOUNTER — Ambulatory Visit: Admitting: Nurse Practitioner

## 2023-06-24 ENCOUNTER — Encounter: Payer: Self-pay | Admitting: Nurse Practitioner

## 2023-06-24 VITALS — BP 133/87 | HR 80 | Temp 98.3°F | Ht 64.0 in | Wt 182.0 lb

## 2023-06-24 DIAGNOSIS — I10 Essential (primary) hypertension: Secondary | ICD-10-CM

## 2023-06-24 DIAGNOSIS — J019 Acute sinusitis, unspecified: Secondary | ICD-10-CM

## 2023-06-24 DIAGNOSIS — B9689 Other specified bacterial agents as the cause of diseases classified elsewhere: Secondary | ICD-10-CM | POA: Diagnosis not present

## 2023-06-24 NOTE — Progress Notes (Signed)
 Subjective:    Patient ID: Kara Schwartz, female    DOB: April 30, 1985, 38 y.o.   MRN: [id number]  HPI Presents for recheck on her blood pressure after increasing her valsartan  to 160 mg daily.  Has not checked her BP outside the office but has access to a machine at her parents house which is next-door.  Denies any adverse effects from increase blood pressure medication dosing. Has had cough and congestion for 2 weeks.  Was prescribed amoxicillin  on 6/16.  Symptoms have improved.  Started with sore throat but this is improved, now mainly scratchy.  Mostly nonproductive cough.  Produces congestion mainly in the mornings, thick yellow mucus.  Had a slight bloody nose 1 time.  No ear pain.  Slight crusting in the eyes for the past 3 to 4 days in the mornings.  Of note patient's previous GI symptoms are much improved.   Review of Systems  Constitutional:  Negative for fever.  HENT:  Positive for congestion, postnasal drip, sinus pressure and sore throat. Negative for ear pain.   Respiratory:  Positive for cough. Negative for chest tightness, shortness of breath and wheezing.   Cardiovascular:  Negative for chest pain.      06/24/2023    8:52 AM  Depression screen PHQ 2/9  Decreased Interest 0  Down, Depressed, Hopeless 0  PHQ - 2 Score 0  Altered sleeping 0  Tired, decreased energy 0  Change in appetite 0  Feeling bad or failure about yourself  0  Trouble concentrating 0  Moving slowly or fidgety/restless 0  Suicidal thoughts 0  PHQ-9 Score 0  Difficult doing work/chores Not difficult at all      06/24/2023    8:52 AM 05/24/2023   10:30 AM 06/01/2022    9:37 AM 07/28/2020    2:36 PM  GAD 7 : Generalized Anxiety Score  Nervous, Anxious, on Edge 0 0 1 1  Control/stop worrying 0 0 1 3  Worry too much - different things 0 0 1 3  Trouble relaxing 0 0 1 1  Restless 0 0 0 0  Easily annoyed or irritable 1 0 0 2  Afraid - awful might happen 0 0 0 0  Total GAD 7 Score 1 0 4 10  Anxiety  Difficulty Not difficult at all Not difficult at all Somewhat difficult Somewhat difficult    Social History   Tobacco Use   Smoking status: Never   Smokeless tobacco: Never  Substance Use Topics   Alcohol use: Yes    Comment: occasionally   Drug use: No        Objective:   Physical Exam NAD.  Alert, oriented.  TMs retracted bilaterally, no erythema.  Conjunctiva clear.  No preauricular adenopathy.  Nasal mucosa mildly boggy with moderate erythema noted along the septum bilaterally.  No active bleeding.  Pharynx clear and moist.  Neck supple with mild soft anterior cervical adenopathy.  Lungs clear.  Occasional nonproductive cough noted.  No tachypnea.  Heart regular rate rhythm. Today's Vitals   06/24/23 0846  BP: 133/87  Pulse: 80  Temp: 98.3 F (36.8 C)  SpO2: 100%  Weight: 182 lb (82.6 kg)  Height: 5' 4 (1.626 m)   Body mass index is 31.24 kg/m.        Assessment & Plan:   Problem List Items Addressed This Visit       Cardiovascular and Mediastinum   Primary hypertension - Primary   Other Visit Diagnoses  Acute bacterial rhinosinusitis          Continue valsartan  180 mg daily.  Encourage patient to check BP several times a week outside of the office at different times of the day record and send results.  Discussed lifestyle factors affecting blood pressure. Complete amoxicillin  as directed.  OTC medications as directed for symptoms.  Recommend saline nasal spray followed by small amount of Vaseline inside the nose.  Trial of Flonase  but discontinue if any nasal bleeding.  Call back if worsens or persists. Return in about 6 months (around 12/24/2023).

## 2023-07-05 ENCOUNTER — Encounter: Payer: Self-pay | Admitting: Nurse Practitioner

## 2023-07-06 ENCOUNTER — Encounter: Payer: Self-pay | Admitting: Nurse Practitioner

## 2023-07-07 ENCOUNTER — Other Ambulatory Visit: Payer: Self-pay | Admitting: Nurse Practitioner

## 2023-07-07 MED ORDER — AZITHROMYCIN 250 MG PO TABS: ORAL_TABLET | ORAL | 0 refills | Status: DC

## 2023-07-07 MED ORDER — PROMETHAZINE-DM 6.25-15 MG/5ML PO SYRP: 5.0000 mL | ORAL_SOLUTION | Freq: Four times a day (QID) | ORAL | 0 refills | Status: DC | PRN

## 2023-07-07 NOTE — Telephone Encounter (Signed)
 Nurses I wrote out a prescription for a blood pressure cuff for the patient-she can pick this up or you can mail it to her If possible I would like for her to do the following then send us  some readings with that cuff also when she follows up in August to bring that cuff with her  My advice is that the patient go online and purchase a automated blood pressure cuff made by [employer] series 3 or 5.  These particular types and monitors are approximately $35-$55 depending on where you buy it.  They are reliable versus some of the other brands have questionable reliability.  Also very important to follow specific technique Blood pressure should be taken after sitting for at least 5 minutes arm supported so that the cuff is at heart level. No caffeine within 1 hour of checking blood pressure Also it is recommended to be sitting with both feet on the floor resting, relaxed, empty bladder as well, no talking while checking blood pressure, check blood pressure write down the number wait 1 minute check it again write down the number again Take the best blood pressure reading Check blood pressure periodically occasionally morning occasionally later in the day not recommended to check it several times a day because it can become a source of worry or concern which can cause blood pressure to go up  Also we would recommend an office visit with the blood pressure monitor and to have that checked against the wall unit to see how close the readings are  It is hard to know if the current blood pressure readings are that elevated or if the cuff may be mildly off.

## 2023-07-13 ENCOUNTER — Encounter: Payer: Self-pay | Admitting: Nurse Practitioner

## 2023-07-14 NOTE — Telephone Encounter (Signed)
 Nurses Before adjusting any medications I would recommend a nurse visit-needs to be on the day that I am present Where is the blood pressure is checked with the wall unit and with her unit to see if they come close  Please see the additional documentation below from the previous note that I made to the patient on MyChart  Also we would recommend an office visit with the blood pressure monitor and to have that checked against the wall unit to see how close the readings are   It is hard to know if the current blood pressure readings are that elevated or if the cuff may be mildly off.  She also has a follow-up visit in August please keep as well

## 2023-07-15 NOTE — Telephone Encounter (Deleted)
 Spoke with pt she states she is currently on escitalopram  10 mg as she was instructed to taper down from the 20 mg per provider previously.

## 2023-07-18 ENCOUNTER — Encounter: Payer: Self-pay | Admitting: Family Medicine

## 2023-07-18 NOTE — Telephone Encounter (Signed)
 Actually her husband can go in either direction If he can get in with orthopedics in the reasonable fashion he may be best to go with them because they can do evaluation and x-ray all at their office If it is going to take weeks to get in with them then office visit with us  Another option is [facility]  urgent care on [address]-they staff that clinic with the PA who will do an initial evaluation and x-rays on a walk-in basis  Please let [place] know however we can help let us  know thank you

## 2023-07-21 ENCOUNTER — Telehealth: Payer: Self-pay

## 2023-07-21 ENCOUNTER — Ambulatory Visit

## 2023-07-21 ENCOUNTER — Other Ambulatory Visit: Payer: Self-pay

## 2023-07-21 DIAGNOSIS — E785 Hyperlipidemia, unspecified: Secondary | ICD-10-CM

## 2023-07-21 DIAGNOSIS — R7301 Impaired fasting glucose: Secondary | ICD-10-CM

## 2023-07-21 DIAGNOSIS — I10 Essential (primary) hypertension: Secondary | ICD-10-CM

## 2023-07-21 NOTE — Telephone Encounter (Signed)
 Blood pressure is good, continue healthy diet, stay physically active, keep follow-up visit in August  Also please do metabolic 7, lipid, A1c, urine ACR several days before follow-up visit Diagnosis hypertension hyperglycemia Let the patient know that we would like for her to do blood work with urine specimen to check for protein before her follow-up visit thank you

## 2023-07-21 NOTE — Progress Notes (Signed)
 Bp check  132/84 in office 140/84 with home bp cuff   Has 160's at home. Checks bp in evening. Take medications before bed.

## 2023-07-21 NOTE — Telephone Encounter (Signed)
 Bp check today 132/84  132/84 in office 140/84 with home bp cuff    Has 160's at home. Checks bp in evening. Take medications before bed.

## 2023-08-05 ENCOUNTER — Other Ambulatory Visit: Payer: Self-pay | Admitting: Family Medicine

## 2023-08-05 DIAGNOSIS — R030 Elevated blood-pressure reading, without diagnosis of hypertension: Secondary | ICD-10-CM

## 2023-08-08 ENCOUNTER — Other Ambulatory Visit: Payer: Self-pay

## 2023-08-08 DIAGNOSIS — R030 Elevated blood-pressure reading, without diagnosis of hypertension: Secondary | ICD-10-CM

## 2023-08-08 MED ORDER — VALSARTAN 160 MG PO TABS: 160.0000 mg | ORAL_TABLET | Freq: Every day | ORAL | 1 refills | Status: DC

## 2023-08-24 DIAGNOSIS — I10 Essential (primary) hypertension: Secondary | ICD-10-CM | POA: Diagnosis not present

## 2023-08-24 DIAGNOSIS — R7301 Impaired fasting glucose: Secondary | ICD-10-CM | POA: Diagnosis not present

## 2023-08-24 DIAGNOSIS — E785 Hyperlipidemia, unspecified: Secondary | ICD-10-CM | POA: Diagnosis not present

## 2023-08-25 LAB — BASIC METABOLIC PANEL WITH GFR
BUN/Creatinine Ratio: 16 (ref 9–23)
BUN: 12 mg/dL (ref 6–20)
CO2: 23 mmol/L (ref 20–29)
Calcium: 9.4 mg/dL (ref 8.7–10.2)
Chloride: 101 mmol/L (ref 96–106)
Creatinine, Ser: 0.75 mg/dL (ref 0.57–1.00)
Glucose: 92 mg/dL (ref 70–99)
Potassium: 5 mmol/L (ref 3.5–5.2)
Sodium: 139 mmol/L (ref 134–144)
eGFR: 104 mL/min/1.73 (ref >59)

## 2023-08-25 LAB — LIPID PANEL
Chol/HDL Ratio: 4.8 ratio — ABNORMAL HIGH (ref 0.0–4.4)
Cholesterol, Total: 342 mg/dL — ABNORMAL HIGH (ref 100–199)
HDL: 71 mg/dL (ref >39)
LDL Chol Calc (NIH): 224 mg/dL — ABNORMAL HIGH (ref 0–99)
Triglycerides: 238 mg/dL — ABNORMAL HIGH (ref 0–149)
VLDL Cholesterol Cal: 47 mg/dL — ABNORMAL HIGH (ref 5–40)

## 2023-08-25 LAB — MICROALBUMIN / CREATININE URINE RATIO
Creatinine, Urine: 153 mg/dL
Microalb/Creat Ratio: 183 mg/g{creat} — ABNORMAL HIGH (ref 0–29)
Microalbumin, Urine: 280.4 ug/mL

## 2023-08-25 LAB — HEMOGLOBIN A1C
Est. average glucose Bld gHb Est-mCnc: 117 mg/dL
Hgb A1c MFr Bld: 5.7 % — ABNORMAL HIGH (ref 4.8–5.6)

## 2023-08-26 ENCOUNTER — Ambulatory Visit: Payer: Self-pay | Admitting: Family Medicine

## 2023-08-30 ENCOUNTER — Encounter: Payer: Self-pay | Admitting: Family Medicine

## 2023-08-30 ENCOUNTER — Ambulatory Visit: Admitting: Family Medicine

## 2023-08-30 VITALS — BP 144/82 | HR 69 | Temp 97.5°F | Ht 64.0 in | Wt 180.0 lb

## 2023-08-30 DIAGNOSIS — I10 Essential (primary) hypertension: Secondary | ICD-10-CM

## 2023-08-30 DIAGNOSIS — R809 Proteinuria, unspecified: Secondary | ICD-10-CM

## 2023-08-30 DIAGNOSIS — E785 Hyperlipidemia, unspecified: Secondary | ICD-10-CM | POA: Diagnosis not present

## 2023-08-30 DIAGNOSIS — R7301 Impaired fasting glucose: Secondary | ICD-10-CM | POA: Diagnosis not present

## 2023-08-30 DIAGNOSIS — R030 Elevated blood-pressure reading, without diagnosis of hypertension: Secondary | ICD-10-CM

## 2023-08-30 MED ORDER — VALSARTAN 160 MG PO TABS: 160.0000 mg | ORAL_TABLET | Freq: Every day | ORAL | 5 refills | Status: DC

## 2023-08-30 NOTE — Progress Notes (Signed)
 Kara Schwartz is a 38 year old female with hypertension and hyperlipidemia who presents for a follow-up visit.  She has been experiencing slightly elevated blood pressure readings in the afternoon, around 2 PM, despite taking her medication at night. Her morning readings are typically within normal range. She has been monitoring her blood pressure intermittently and mentioned a reading of 'around fifty' without specifying units.  Since January, she has been focusing on weight loss and has lost at least ten pounds. Her diet consists of one meal a day, primarily chicken, steak, and occasionally bacon, while trying to cut back on cholesterol. She drinks a lot of water, occasionally coffee, and rarely [facility]. She avoids mid-morning snacks and typically eats dinner, sometimes substituting lunch for dinner. She is considering reducing bacon intake and exploring healthier breakfast options.  She has a family history of high cholesterol.  Her exercise routine is sporadic due to time constraints, but she prefers walking and short exercises like stomach workouts. She aims to incorporate more structured exercise into her routine, including walking and strength training.  Recent lab work showed elevated protein levels in her urine, which was not present a year ago. Follow-up testing is planned in three months to monitor this issue.  Her A1c level was noted to be 5.7, indicating prediabetes. She has a family history of diabetes and is focused on maintaining a healthy diet and regular physical activity to manage her weight and prevent progression to diabetes  Current Outpatient Medications on File Prior to Visit  Medication Sig Dispense Refill   azithromycin  (ZITHROMAX  Z-PAK) 250 MG tablet Take 2 tablets (500 mg) on  Day 1,  followed by 1 tablet (250 mg) once daily on Days 2 through 5. 6 each 0   promethazine -dextromethorphan (PROMETHAZINE -DM) 6.25-15 MG/5ML syrup Take 5 mLs by mouth 4 (four) times daily  as needed. Drowsiness precautions. 118 mL 0   acetaminophen  (TYLENOL ) 325 MG tablet Take 650 mg by mouth every 6 (six) hours as needed. For pain.      escitalopram  (LEXAPRO ) 20 MG tablet Take 1 tablet (20 mg total) by mouth daily. 90 tablet 1   ibuprofen  (ADVIL ) 200 MG tablet Take 200 mg by mouth every 6 (six) hours as needed.     rizatriptan  (MAXALT ) 10 MG tablet Take 1 tablet (10 mg total) by mouth as needed for migraine. May repeat in 2 hours if needed 15 tablet 12   valsartan  (DIOVAN ) 160 MG tablet Take 1 tablet (160 mg total) by mouth daily. 30 tablet 1   No current facility-administered medications on file prior to visit.    Past Medical History:  Diagnosis Date   Headache(784.0)    Hypertriglyceridemia    Insomnia 07/31/2018   No pertinent past medical history    General-in no acute distress Eyes-no discharge Lungs-respiratory rate normal, CTA CV-no murmurs,RRR Extremities skin warm dry no edema Neuro grossly normal Behavior normal, alert   Hypertension Blood pressure elevated in afternoons, normal in mornings. Stress and caffeine may contribute. - Check blood pressure periodically over the next few weeks and send readings via MyChart. - Ensure blood pressure is checked after sitting for at least five minutes, with feet on the floor, and not within 60 minutes of caffeine intake. - Reassess blood pressure in three weeks.  Proteinuria Elevated protein in urine suggests kidney stress, possibly related to hypertension. Normal kidney function blood work. - Repeat urine test in three months to reassess protein levels. - If proteinuria persists, consider referral  to a nephrologist.  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 Discussed genetic nature and potential need for medication. - Continue dietary modifications and weight management efforts. - Repeat blood work in three months to reassess cholesterol  levels. - Consider starting statin therapy if LDL remains above 190 in three months.  Prediabetes A1c level at 5.7 indicates prediabetes. Emphasized importance of diet and exercise to prevent diabetes. - Continue healthy eating and regular physical activity. - Monitor A1c levels annually.  Patient agrees to do follow-up lab testing in 3 months with a follow-up office visit 6 months In 3 months if protein still present referral to nephrology If cholesterol still is significantly elevated we will forward with medications

## 2023-09-06 ENCOUNTER — Telehealth: Payer: Self-pay | Admitting: Family Medicine

## 2023-09-06 NOTE — Telephone Encounter (Signed)
 Front staff FYI-it appears patient has 2 appointments in December Both are with Elveria Quarry, FNP She really only needs 1  Please consolidate around patient's wishes and cancel the other  Thank you-Dr. Glendia

## 2023-09-07 ENCOUNTER — Other Ambulatory Visit: Payer: Self-pay

## 2023-09-07 DIAGNOSIS — R7301 Impaired fasting glucose: Secondary | ICD-10-CM

## 2023-09-07 DIAGNOSIS — I10 Essential (primary) hypertension: Secondary | ICD-10-CM

## 2023-09-07 DIAGNOSIS — E785 Hyperlipidemia, unspecified: Secondary | ICD-10-CM

## 2023-09-09 MED ORDER — ESCITALOPRAM OXALATE 20 MG PO TABS: 20.0000 mg | ORAL_TABLET | Freq: Every day | ORAL | 1 refills | Status: DC

## 2023-10-29 ENCOUNTER — Encounter: Payer: Self-pay | Admitting: Family Medicine

## 2023-11-02 ENCOUNTER — Encounter: Payer: Self-pay | Admitting: Family Medicine

## 2023-11-02 DIAGNOSIS — G43009 Migraine without aura, not intractable, without status migrainosus: Secondary | ICD-10-CM

## 2023-11-02 MED ORDER — RIZATRIPTAN BENZOATE 10 MG PO TABS: 10.0000 mg | ORAL_TABLET | ORAL | 12 refills | Status: DC | PRN

## 2023-11-21 ENCOUNTER — Encounter: Payer: Self-pay | Admitting: Family Medicine

## 2023-11-21 NOTE — Telephone Encounter (Signed)
 Nurses He may have a school note for today Please call with mom If he continues to have health problems she would like for him to be worked in Tuesday or Wednesday please let us  know-then we could work him in with either Charmaine Coy, Dr. Bluford, or myself  Thanks-Dr. Glendia

## 2023-12-06 ENCOUNTER — Encounter: Payer: Self-pay | Admitting: Family Medicine

## 2023-12-09 ENCOUNTER — Other Ambulatory Visit: Payer: Self-pay | Admitting: Nurse Practitioner

## 2023-12-09 DIAGNOSIS — R030 Elevated blood-pressure reading, without diagnosis of hypertension: Secondary | ICD-10-CM

## 2023-12-09 MED ORDER — ESCITALOPRAM OXALATE 20 MG PO TABS: 20.0000 mg | ORAL_TABLET | Freq: Every day | ORAL | 1 refills | Status: DC

## 2023-12-09 MED ORDER — VALSARTAN 160 MG PO TABS: 160.0000 mg | ORAL_TABLET | Freq: Every day | ORAL | 1 refills | Status: DC

## 2023-12-22 ENCOUNTER — Ambulatory Visit: Admitting: Nurse Practitioner

## 2023-12-26 ENCOUNTER — Ambulatory Visit: Admitting: Nurse Practitioner

## 2024-01-07 ENCOUNTER — Encounter: Payer: Self-pay | Admitting: Family Medicine

## 2024-01-09 ENCOUNTER — Other Ambulatory Visit: Payer: Self-pay | Admitting: Nurse Practitioner

## 2024-01-09 DIAGNOSIS — R030 Elevated blood-pressure reading, without diagnosis of hypertension: Secondary | ICD-10-CM

## 2024-01-09 DIAGNOSIS — G43009 Migraine without aura, not intractable, without status migrainosus: Secondary | ICD-10-CM

## 2024-01-09 MED ORDER — VALSARTAN 160 MG PO TABS: 160.0000 mg | ORAL_TABLET | Freq: Every day | ORAL | 1 refills | Status: AC

## 2024-01-09 MED ORDER — ESCITALOPRAM OXALATE 20 MG PO TABS: 20.0000 mg | ORAL_TABLET | Freq: Every day | ORAL | 1 refills | Status: AC

## 2024-01-09 MED ORDER — RIZATRIPTAN BENZOATE 10 MG PO TABS: 10.0000 mg | ORAL_TABLET | ORAL | 5 refills | Status: AC | PRN

## 2024-03-05 ENCOUNTER — Ambulatory Visit: Admitting: Family Medicine
